# Patient Record
Sex: Male | Born: 1960 | Race: White | Hispanic: No | Marital: Married | Smoking: Never smoker
Health system: Southern US, Community
[De-identification: ages and names within clinical notes are randomized; demographics above are authoritative.]

## PROBLEM LIST (undated history)

## (undated) DIAGNOSIS — E785 Hyperlipidemia, unspecified: Secondary | ICD-10-CM

## (undated) DIAGNOSIS — J45909 Unspecified asthma, uncomplicated: Secondary | ICD-10-CM

## (undated) DIAGNOSIS — Z9889 Other specified postprocedural states: Secondary | ICD-10-CM

## (undated) DIAGNOSIS — Z87442 Personal history of urinary calculi: Secondary | ICD-10-CM

## (undated) DIAGNOSIS — Z973 Presence of spectacles and contact lenses: Secondary | ICD-10-CM

## (undated) DIAGNOSIS — M1990 Unspecified osteoarthritis, unspecified site: Secondary | ICD-10-CM

## (undated) DIAGNOSIS — R7303 Prediabetes: Secondary | ICD-10-CM

## (undated) DIAGNOSIS — R251 Tremor, unspecified: Secondary | ICD-10-CM

## (undated) DIAGNOSIS — K219 Gastro-esophageal reflux disease without esophagitis: Secondary | ICD-10-CM

## (undated) DIAGNOSIS — N401 Enlarged prostate with lower urinary tract symptoms: Secondary | ICD-10-CM

## (undated) DIAGNOSIS — Z86010 Personal history of colonic polyps: Secondary | ICD-10-CM

## (undated) DIAGNOSIS — K08109 Complete loss of teeth, unspecified cause, unspecified class: Secondary | ICD-10-CM

## (undated) DIAGNOSIS — Z860101 Personal history of adenomatous and serrated colon polyps: Secondary | ICD-10-CM

## (undated) DIAGNOSIS — Z972 Presence of dental prosthetic device (complete) (partial): Secondary | ICD-10-CM

## (undated) HISTORY — PX: EXTRACORPOREAL SHOCK WAVE LITHOTRIPSY: SHX1557

## (undated) HISTORY — DX: Unspecified asthma, uncomplicated: J45.909

## (undated) HISTORY — PX: FRACTURE SURGERY: SHX138

## (undated) HISTORY — PX: ESOPHAGOGASTRODUODENOSCOPY (EGD) WITH ESOPHAGEAL DILATION: SHX5812

## (undated) HISTORY — PX: OTHER SURGICAL HISTORY: SHX169

## (undated) SURGERY — Surgical Case
Anesthesia: *Unknown

---

## 2002-05-16 ENCOUNTER — Ambulatory Visit (HOSPITAL_COMMUNITY): Admission: RE | Admit: 2002-05-16 | Discharge: 2002-05-16 | Payer: Self-pay | Admitting: Orthopedic Surgery

## 2002-05-16 ENCOUNTER — Encounter: Payer: Self-pay | Admitting: Orthopedic Surgery

## 2002-05-30 ENCOUNTER — Encounter: Payer: Self-pay | Admitting: Orthopedic Surgery

## 2002-05-30 ENCOUNTER — Ambulatory Visit (HOSPITAL_COMMUNITY): Admission: RE | Admit: 2002-05-30 | Discharge: 2002-05-30 | Payer: Self-pay | Admitting: Orthopedic Surgery

## 2006-04-17 ENCOUNTER — Ambulatory Visit (HOSPITAL_COMMUNITY): Admission: RE | Admit: 2006-04-17 | Discharge: 2006-04-17 | Payer: Self-pay | Admitting: Anesthesiology

## 2008-06-23 ENCOUNTER — Emergency Department (HOSPITAL_COMMUNITY): Admission: EM | Admit: 2008-06-23 | Discharge: 2008-06-23 | Payer: Self-pay | Admitting: Emergency Medicine

## 2008-09-04 ENCOUNTER — Ambulatory Visit (HOSPITAL_COMMUNITY): Admission: RE | Admit: 2008-09-04 | Discharge: 2008-09-04 | Payer: Self-pay | Admitting: Anesthesiology

## 2008-10-26 ENCOUNTER — Ambulatory Visit (HOSPITAL_COMMUNITY): Admission: RE | Admit: 2008-10-26 | Discharge: 2008-10-26 | Payer: Self-pay | Admitting: Anesthesiology

## 2010-05-21 ENCOUNTER — Emergency Department (HOSPITAL_BASED_OUTPATIENT_CLINIC_OR_DEPARTMENT_OTHER): Admission: EM | Admit: 2010-05-21 | Discharge: 2010-05-21 | Payer: Self-pay | Admitting: Emergency Medicine

## 2010-05-27 ENCOUNTER — Emergency Department (HOSPITAL_BASED_OUTPATIENT_CLINIC_OR_DEPARTMENT_OTHER): Admission: EM | Admit: 2010-05-27 | Discharge: 2010-05-27 | Payer: Self-pay | Admitting: Emergency Medicine

## 2011-05-13 LAB — POCT I-STAT, CHEM 8
BUN: 9
Calcium, Ion: 1.21
Creatinine, Ser: 0.9
Glucose, Bld: 100 — ABNORMAL HIGH
TCO2: 27

## 2011-05-13 LAB — URINE MICROSCOPIC-ADD ON

## 2011-05-13 LAB — URINALYSIS, ROUTINE W REFLEX MICROSCOPIC
Bilirubin Urine: NEGATIVE
Leukocytes, UA: NEGATIVE
Nitrite: NEGATIVE
Specific Gravity, Urine: 1.02
pH: 6.5

## 2013-09-09 ENCOUNTER — Emergency Department (HOSPITAL_COMMUNITY): Payer: Medicare Other | Admitting: Anesthesiology

## 2013-09-09 ENCOUNTER — Encounter (HOSPITAL_COMMUNITY): Payer: Self-pay | Admitting: Emergency Medicine

## 2013-09-09 ENCOUNTER — Encounter (HOSPITAL_COMMUNITY)
Admission: EM | Disposition: A | Discharge status: Home / Self Care | Payer: Self-pay | Source: Home / Self Care | Attending: Emergency Medicine

## 2013-09-09 ENCOUNTER — Ambulatory Visit (HOSPITAL_COMMUNITY)
Admission: EM | Admit: 2013-09-09 | Discharge: 2013-09-10 | Disposition: A | Discharge status: Home / Self Care | Payer: Medicare Other | Attending: Urology | Admitting: Urology

## 2013-09-09 ENCOUNTER — Encounter (HOSPITAL_COMMUNITY): Payer: Medicare Other | Admitting: Anesthesiology

## 2013-09-09 DIAGNOSIS — N200 Calculus of kidney: Secondary | ICD-10-CM | POA: Insufficient documentation

## 2013-09-09 DIAGNOSIS — N201 Calculus of ureter: Secondary | ICD-10-CM | POA: Insufficient documentation

## 2013-09-09 DIAGNOSIS — N400 Enlarged prostate without lower urinary tract symptoms: Secondary | ICD-10-CM | POA: Insufficient documentation

## 2013-09-09 DIAGNOSIS — Z87442 Personal history of urinary calculi: Secondary | ICD-10-CM | POA: Insufficient documentation

## 2013-09-09 HISTORY — PX: CYSTOSCOPY/RETROGRADE/URETEROSCOPY/STONE EXTRACTION WITH BASKET: SHX5317

## 2013-09-09 LAB — CBC WITH DIFFERENTIAL/PLATELET
BASOS PCT: 0 % (ref 0–1)
Basophils Absolute: 0 10*3/uL (ref 0.0–0.1)
EOS ABS: 0.1 10*3/uL (ref 0.0–0.7)
EOS PCT: 3 % (ref 0–5)
HEMATOCRIT: 44.5 % (ref 39.0–52.0)
HEMOGLOBIN: 15.2 g/dL (ref 13.0–17.0)
Lymphocytes Relative: 12 % (ref 12–46)
Lymphs Abs: 0.5 10*3/uL — ABNORMAL LOW (ref 0.7–4.0)
MCH: 30.9 pg (ref 26.0–34.0)
MCHC: 34.2 g/dL (ref 30.0–36.0)
MCV: 90.4 fL (ref 78.0–100.0)
Monocytes Absolute: 0.6 10*3/uL (ref 0.1–1.0)
Monocytes Relative: 14 % — ABNORMAL HIGH (ref 3–12)
NEUTROS PCT: 72 % (ref 43–77)
Neutro Abs: 3 10*3/uL (ref 1.7–7.7)
PLATELETS: 231 10*3/uL (ref 150–400)
RBC: 4.92 MIL/uL (ref 4.22–5.81)
RDW: 12.7 % (ref 11.5–15.5)
WBC: 4.2 10*3/uL (ref 4.0–10.5)

## 2013-09-09 LAB — URINALYSIS, ROUTINE W REFLEX MICROSCOPIC
GLUCOSE, UA: NEGATIVE mg/dL
KETONES UR: NEGATIVE mg/dL
LEUKOCYTES UA: NEGATIVE
Nitrite: NEGATIVE
PROTEIN: NEGATIVE mg/dL
Specific Gravity, Urine: 1.029 (ref 1.005–1.030)
UROBILINOGEN UA: 0.2 mg/dL (ref 0.0–1.0)
pH: 5.5 (ref 5.0–8.0)

## 2013-09-09 LAB — BASIC METABOLIC PANEL
BUN: 16 mg/dL (ref 6–23)
CHLORIDE: 100 meq/L (ref 96–112)
CO2: 24 mEq/L (ref 19–32)
Calcium: 8.8 mg/dL (ref 8.4–10.5)
Creatinine, Ser: 0.97 mg/dL (ref 0.50–1.35)
GFR calc non Af Amer: >90 mL/min (ref >90)
Glucose, Bld: 128 mg/dL — ABNORMAL HIGH (ref 70–99)
POTASSIUM: 4.1 meq/L (ref 3.7–5.3)
SODIUM: 137 meq/L (ref 137–147)

## 2013-09-09 LAB — URINE MICROSCOPIC-ADD ON

## 2013-09-09 SURGERY — CYSTOSCOPY, WITH CALCULUS REMOVAL USING BASKET
Anesthesia: General | Site: Ureter | Laterality: Left

## 2013-09-09 MED ORDER — FENTANYL CITRATE 0.05 MG/ML IJ SOLN
INTRAMUSCULAR | Status: AC
  Filled 2013-09-09: qty 2

## 2013-09-09 MED ORDER — HYDROMORPHONE HCL PF 1 MG/ML IJ SOLN
1.0000 mg | Freq: Once | INTRAMUSCULAR | Status: AC
  Administered 2013-09-09: 1 mg via INTRAVENOUS
  Filled 2013-09-09: qty 1

## 2013-09-09 MED ORDER — CIPROFLOXACIN IN D5W 400 MG/200ML IV SOLN
400.0000 mg | Freq: Two times a day (BID) | INTRAVENOUS | Status: DC
  Administered 2013-09-09: 400 mg via INTRAVENOUS
  Filled 2013-09-09 (×3): qty 200

## 2013-09-09 MED ORDER — MIDAZOLAM HCL 5 MG/5ML IJ SOLN
INTRAMUSCULAR | Status: DC | PRN
  Administered 2013-09-09: 2 mg via INTRAVENOUS

## 2013-09-09 MED ORDER — CIPROFLOXACIN IN D5W 400 MG/200ML IV SOLN
INTRAVENOUS | Status: AC
  Filled 2013-09-09: qty 200

## 2013-09-09 MED ORDER — BELLADONNA ALKALOIDS-OPIUM 16.2-60 MG RE SUPP
RECTAL | Status: AC
  Filled 2013-09-09: qty 1

## 2013-09-09 MED ORDER — KETOROLAC TROMETHAMINE 30 MG/ML IJ SOLN
30.0000 mg | Freq: Once | INTRAMUSCULAR | Status: AC
  Administered 2013-09-09: 30 mg via INTRAVENOUS
  Filled 2013-09-09: qty 1

## 2013-09-09 MED ORDER — FENTANYL CITRATE 0.05 MG/ML IJ SOLN
INTRAMUSCULAR | Status: AC
  Filled 2013-09-09: qty 5

## 2013-09-09 MED ORDER — LIDOCAINE HCL (CARDIAC) 10 MG/ML IV SOLN
INTRAVENOUS | Status: DC | PRN
  Administered 2013-09-09: 75 mg via INTRAVENOUS

## 2013-09-09 MED ORDER — ONDANSETRON HCL 4 MG/2ML IJ SOLN
INTRAMUSCULAR | Status: AC
  Filled 2013-09-09: qty 2

## 2013-09-09 MED ORDER — OXYCODONE HCL 5 MG PO TABS: 5.0000 mg | ORAL_TABLET | ORAL | Status: DC | PRN

## 2013-09-09 MED ORDER — LIDOCAINE HCL 2 % EX GEL
CUTANEOUS | Status: DC | PRN
  Administered 2013-09-09: 1 via URETHRAL

## 2013-09-09 MED ORDER — PROMETHAZINE HCL 25 MG/ML IJ SOLN: 12.5000 mg | Freq: Once | INTRAMUSCULAR | Status: AC | PRN

## 2013-09-09 MED ORDER — MIDAZOLAM HCL 2 MG/2ML IJ SOLN
INTRAMUSCULAR | Status: AC
  Filled 2013-09-09: qty 2

## 2013-09-09 MED ORDER — ACTIDOSE WITH SORBITOL 50 GM/240ML PO LIQD
50.0000 g | Freq: Once | ORAL | Status: DC
  Filled 2013-09-09: qty 240

## 2013-09-09 MED ORDER — DEXAMETHASONE SODIUM PHOSPHATE 10 MG/ML IJ SOLN
INTRAMUSCULAR | Status: DC | PRN
  Administered 2013-09-09: 10 mg via INTRAVENOUS

## 2013-09-09 MED ORDER — PROPOFOL 10 MG/ML IV BOLUS
INTRAVENOUS | Status: AC
  Filled 2013-09-09: qty 20

## 2013-09-09 MED ORDER — BELLADONNA ALKALOIDS-OPIUM 16.2-60 MG RE SUPP
RECTAL | Status: DC | PRN
Start: 2013-09-09 — End: 2013-09-09
  Administered 2013-09-09: 1 via RECTAL

## 2013-09-09 MED ORDER — MEPERIDINE HCL 50 MG/ML IJ SOLN: 6.2500 mg | INTRAMUSCULAR | Status: DC | PRN

## 2013-09-09 MED ORDER — INFLUENZA VAC SPLIT QUAD 0.5 ML IM SUSP: 0.5000 mL | INTRAMUSCULAR | Status: DC

## 2013-09-09 MED ORDER — LIDOCAINE HCL 2 % EX GEL
CUTANEOUS | Status: AC
  Filled 2013-09-09: qty 10

## 2013-09-09 MED ORDER — PHENYLEPHRINE HCL 10 MG/ML IJ SOLN
INTRAMUSCULAR | Status: DC | PRN
  Administered 2013-09-09 (×4): 80 ug via INTRAVENOUS

## 2013-09-09 MED ORDER — DEXAMETHASONE SODIUM PHOSPHATE 10 MG/ML IJ SOLN
INTRAMUSCULAR | Status: AC
  Filled 2013-09-09: qty 1

## 2013-09-09 MED ORDER — FENTANYL CITRATE 0.05 MG/ML IJ SOLN
INTRAMUSCULAR | Status: DC | PRN
  Administered 2013-09-09 (×2): 50 ug via INTRAVENOUS

## 2013-09-09 MED ORDER — DOCUSATE SODIUM 100 MG PO CAPS: 100.0000 mg | ORAL_CAPSULE | Freq: Two times a day (BID) | ORAL | Status: DC | PRN

## 2013-09-09 MED ORDER — ONDANSETRON HCL 4 MG/2ML IJ SOLN
INTRAMUSCULAR | Status: DC | PRN
  Administered 2013-09-09: 4 mg via INTRAVENOUS

## 2013-09-09 MED ORDER — KETOROLAC TROMETHAMINE 30 MG/ML IJ SOLN
30.0000 mg | Freq: Once | INTRAMUSCULAR | Status: AC
  Administered 2013-09-09: 30 mg via INTRAVENOUS
  Filled 2013-09-09 (×2): qty 1

## 2013-09-09 MED ORDER — IOHEXOL 300 MG/ML  SOLN
INTRAMUSCULAR | Status: DC | PRN
  Administered 2013-09-09: 10 mL

## 2013-09-09 MED ORDER — LIDOCAINE HCL (CARDIAC) 20 MG/ML IV SOLN
INTRAVENOUS | Status: AC
  Filled 2013-09-09: qty 5

## 2013-09-09 MED ORDER — LACTATED RINGERS IV SOLN
INTRAVENOUS | Status: DC | PRN
  Administered 2013-09-09 (×2): via INTRAVENOUS

## 2013-09-09 MED ORDER — SODIUM CHLORIDE 0.9 % IR SOLN
Status: DC | PRN
  Administered 2013-09-09: 3000 mL

## 2013-09-09 MED ORDER — SODIUM CHLORIDE 0.9 % IR SOLN
Status: DC | PRN
  Administered 2013-09-09: 1000 mL

## 2013-09-09 MED ORDER — PROPOFOL 10 MG/ML IV BOLUS
INTRAVENOUS | Status: DC | PRN
  Administered 2013-09-09: 160 mg via INTRAVENOUS

## 2013-09-09 MED ORDER — FENTANYL CITRATE 0.05 MG/ML IJ SOLN
25.0000 ug | INTRAMUSCULAR | Status: DC | PRN
  Administered 2013-09-09: 50 ug via INTRAVENOUS

## 2013-09-09 SURGICAL SUPPLY — 21 items
BAG URO CATCHER STRL LF (DRAPE) ×3 IMPLANT
BASKET ZERO TIP NITINOL 2.4FR (BASKET) ×2 IMPLANT
BSKT STON RTRVL ZERO TP 2.4FR (BASKET) ×1
CATH URET 5FR 28IN CONE TIP (BALLOONS)
CATH URET 5FR 28IN OPEN ENDED (CATHETERS) ×2 IMPLANT
CATH URET 5FR 70CM CONE TIP (BALLOONS) ×1 IMPLANT
CLOTH BEACON ORANGE TIMEOUT ST (SAFETY) ×3 IMPLANT
DRAPE CAMERA CLOSED 9X96 (DRAPES) ×3 IMPLANT
GLOVE BIOGEL M STRL SZ7.5 (GLOVE) ×3 IMPLANT
GLOVE BIOGEL PI IND STRL 7.0 (GLOVE) IMPLANT
GLOVE BIOGEL PI INDICATOR 7.0 (GLOVE) ×2
GOWN STRL REUS W/TWL XL LVL3 (GOWN DISPOSABLE) ×5 IMPLANT
GUIDEWIRE STR DUAL SENSOR (WIRE) ×3 IMPLANT
IV NS IRRIG 3000ML ARTHROMATIC (IV SOLUTION) ×4 IMPLANT
MANIFOLD NEPTUNE II (INSTRUMENTS) ×3 IMPLANT
NS IRRIG 1000ML POUR BTL (IV SOLUTION) ×2 IMPLANT
PACK CYSTO (CUSTOM PROCEDURE TRAY) ×3 IMPLANT
STENT CONTOUR 6FRX26X.038 (STENTS) ×2 IMPLANT
TUBING CONNECTING 10 (TUBING) ×2 IMPLANT
TUBING CONNECTING 10' (TUBING) ×1
WIRE COONS/BENSON .038X145CM (WIRE) IMPLANT

## 2013-09-09 NOTE — ED Provider Notes (Signed)
Medical screening examination/treatment/procedure(s) were performed by non-physician practitioner and as supervising physician I was immediately available for consultation/collaboration.  Richarda Blade, MD 09/09/13 [phone]

## 2013-09-09 NOTE — ED Provider Notes (Signed)
CSN: [id number]     Arrival date & time 09/09/13  0700 History   First MD Initiated Contact with Patient 09/09/13 [phone]     Chief Complaint  Patient presents with  . Flank Pain   (Consider location/radiation/quality/duration/timing/severity/associated sxs/prior Treatment) HPI  This is a 53 year old male who presents emergency department chief complaint of flank pain.  He should states he was diagnosed 2 weeks ago with a kidney stone that he states is "very low."  Patient states he had a CT scan done.  He complains of 2 stones on the left side.  He states that he was discharged with Percocet, Flomax and has been doing fair at home.  Patient states he's had constant pain radiating to the left groin and testicle.  However yesterday evening his pain became severely worse.  He is also complaining of pain with urination and suprapubic pressure.  The patient denies any fevers, chills.  He endorses nausea and vomiting associated with his pain.  He states he has a history of recurrent kidney stones.  Patient denies any discharge from the penis.        Past Medical History  Diagnosis Date  . Renal disorder     kidney stones   History reviewed. No pertinent past surgical history. History reviewed. No pertinent family history. History  Substance Use Topics  . Smoking status: Never Smoker   . Smokeless tobacco: Never Used  . Alcohol Use: Yes     Comment: occ.    Review of Systems Ten systems reviewed and are negative for acute change, except as noted in the HPI.   Allergies  Review of patient's allergies indicates no known allergies.  Home Medications  No current outpatient prescriptions on file. BP 107/69  Pulse 70  Temp(Src) 97.5 F (36.4 C) (Oral)  Resp 16  Ht 5\' 6"  (1.676 m)  Wt 145 lb (65.772 kg)  BMI 23.41 kg/m2  SpO2 98% Physical Exam Physical Exam  Nursing note and vitals reviewed. Constitutional: Patient appears uncomfortable, rocking and moving about on the examining  bed. Smells strongly of cigarettes. HENT:  Head: Normocephalic and atraumatic.  Eyes: Conjunctivae normal are normal. No scleral icterus.  Neck: Normal range of motion. Neck supple.  Cardiovascular: Normal rate, regular rhythm and normal heart sounds.   Pulmonary/Chest: Effort normal and breath sounds normal. No respiratory distress.  Abdominal: Soft. TTP LLQ. + L CVA tenderness Musculoskeletal: He exhibits no edema.  Neurological: He is alert.  Skin: Skin is warm and dry. He is not diaphoretic.  Psychiatric: His behavior is normal.    ED Course  Procedures (including critical care time) Labs Review Labs Reviewed - No data to display Imaging Review No results found.  EKG Interpretation   None       MDM   1. Ureteral stone    7:31 AM BP 107/69  Pulse 70  Temp(Src) 97.5 F (36.4 C) (Oral)  Resp 16  Ht 5\' 6"  (1.676 m)  Wt 145 lb (65.772 kg)  BMI 23.41 kg/m2  SpO2 98% Patient with dx of kidney stones. Patient appears very uncomfortable.. I will evaluated creatinine and labs. Pain control initiated.   9:30 AM Filed Vitals:   09/09/13 0706  BP: 107/69  Pulse: 70  Temp: 97.5 F (36.4 C)  TempSrc: Oral  Resp: 16  Height: 5\' 6"  (1.676 m)  Weight: 145 lb (65.772 kg)  SpO2: 98%   Patient continues to have pain. Unable to urinate. In/out cath placed. Creatinine wnl  I spoke with Dr. Alinda Money who asks that we give the patient toradol . Reevaluate. Patient CT showed a 9 mm stone in the left ureter.  I will reconsult if pain continues to be poorly controlled.   Patient pain still uncontrolled. Dr. Alinda Money will admit.   Margarita Mail, PA-C 09/09/13 [phone]

## 2013-09-09 NOTE — ED Notes (Signed)
PA at bedside.

## 2013-09-09 NOTE — Consult Note (Signed)
Urology Consult   Physician requesting consult: Abigail Harris, PA-C  Reason for consult: Left ureteral stone  History of Present Illness: Jose Hartman is a 53 y.o. with a known 9 mm distal left ureteral stone who is followed by Dr. Ottelin.  He presented to the ED with uncontrolled pain today.  His pain is improved but still significant and is localized to the left lower quadrant. He denies fever.  He has had some nausea but no vomiting.  He has a long history of kidney stones in the past.  He has had his current stone for 2 weeks and was supposed to follow up with Dr. Ottelin on Wednesday but had canceled his appointment.    Past Medical History  Diagnosis Date  . Renal disorder     kidney stones    Past Surgical History  Procedure Laterality Date  . Extracorporeal shock wave lithotripsy    . Uretheral stent placement      Current Hospital Medications:  Home Meds:    Medication List    Notice   You have not been prescribed any medications.      Scheduled Meds: . [START ON 09/10/2013] influenza vac split quadrivalent PF  0.5 mL Intramuscular Tomorrow-1000   Continuous Infusions:  PRN Meds:.  Allergies:  Allergies  Allergen Reactions  . Peanuts [Peanut Oil] Hives    Makes ears red and burn     History reviewed. No pertinent family history.  Social History:  reports that he has never smoked. He has never used smokeless tobacco. He reports that he drinks alcohol. He reports that he does not use illicit drugs.  ROS: A complete review of systems was performed.  All systems are negative except for pertinent findings as noted.  Physical Exam:  Vital signs in last 24 hours: Temp:  [97.5 F (36.4 C)] 97.5 F (36.4 C) (01/30 0706) Pulse Rate:  [70-87] 87 (01/30 1201) Resp:  [16-20] 20 (01/30 1201) BP: (107-127)/(69-89) 127/89 mmHg (01/30 1201) SpO2:  [94 %-98 %] 94 % (01/30 1201) Weight:  [65.772 kg (145 lb)] 65.772 kg (145 lb) (01/30 0706) Constitutional:   Alert and oriented, No acute distress Cardiovascular: Regular rate and rhythm, No JVD Respiratory: Normal respiratory effort, Lungs clear bilaterally GI: LLQ pain, No rebound tenderness or guarding GU: No CVA tenderness Lymphatic: No lymphadenopathy Neurologic: Grossly intact, no focal deficits Psychiatric: Normal mood and affect  Laboratory Data:   Recent Labs  09/09/13 0745  WBC 4.2  HGB 15.2  HCT 44.5  PLT 231     Recent Labs  09/09/13 0745  NA 137  K 4.1  CL 100  GLUCOSE 128*  BUN 16  CALCIUM 8.8  CREATININE 0.97     Results for orders placed during the hospital encounter of 09/09/13 (from the past 24 hour(s))  BASIC METABOLIC PANEL     Status: Abnormal   Collection Time    09/09/13  7:45 AM      Result Value Range   Sodium 137  137 - 147 mEq/L   Potassium 4.1  3.7 - 5.3 mEq/L   Chloride 100  96 - 112 mEq/L   CO2 24  19 - 32 mEq/L   Glucose, Bld 128 (*) 70 - 99 mg/dL   BUN 16  6 - 23 mg/dL   Creatinine, Ser 0.97  0.50 - 1.35 mg/dL   Calcium 8.8  8.4 - 10.5 mg/dL   GFR calc non Af Amer >90  >90 mL/min   GFR   calc Af Amer >90  >90 mL/min  CBC WITH DIFFERENTIAL     Status: Abnormal   Collection Time    09/09/13  7:45 AM      Result Value Range   WBC 4.2  4.0 - 10.5 K/uL   RBC 4.92  4.22 - 5.81 MIL/uL   Hemoglobin 15.2  13.0 - 17.0 g/dL   HCT 44.5  39.0 - 52.0 %   MCV 90.4  78.0 - 100.0 fL   MCH 30.9  26.0 - 34.0 pg   MCHC 34.2  30.0 - 36.0 g/dL   RDW 12.7  11.5 - 15.5 %   Platelets 231  150 - 400 K/uL   Neutrophils Relative % 72  43 - 77 %   Neutro Abs 3.0  1.7 - 7.7 K/uL   Lymphocytes Relative 12  12 - 46 %   Lymphs Abs 0.5 (*) 0.7 - 4.0 K/uL   Monocytes Relative 14 (*) 3 - 12 %   Monocytes Absolute 0.6  0.1 - 1.0 K/uL   Eosinophils Relative 3  0 - 5 %   Eosinophils Absolute 0.1  0.0 - 0.7 K/uL   Basophils Relative 0  0 - 1 %   Basophils Absolute 0.0  0.0 - 0.1 K/uL  URINALYSIS, ROUTINE W REFLEX MICROSCOPIC     Status: Abnormal   Collection  Time    09/09/13  9:40 AM      Result Value Range   Color, Urine AMBER (*) YELLOW   APPearance CLEAR  CLEAR   Specific Gravity, Urine 1.029  1.005 - 1.030   pH 5.5  5.0 - 8.0   Glucose, UA NEGATIVE  NEGATIVE mg/dL   Hgb urine dipstick SMALL (*) NEGATIVE   Bilirubin Urine SMALL (*) NEGATIVE   Ketones, ur NEGATIVE  NEGATIVE mg/dL   Protein, ur NEGATIVE  NEGATIVE mg/dL   Urobilinogen, UA 0.2  0.0 - 1.0 mg/dL   Nitrite NEGATIVE  NEGATIVE   Leukocytes, UA NEGATIVE  NEGATIVE  URINE MICROSCOPIC-ADD ON     Status: None   Collection Time    09/09/13  9:40 AM      Result Value Range   Squamous Epithelial / LPF RARE  RARE   RBC / HPF 3-6  <3 RBC/hpf   Urine-Other MUCOUS PRESENT     No results found for this or any previous visit (from the past 240 hour(s)).  Renal Function:  Recent Labs  09/09/13 0745  CREATININE 0.97   Estimated Creatinine Clearance: 78.6 ml/min (by C-G formula based on Cr of 0.97).  Radiologic Imaging: No results found.  I independently reviewed his CT scan from 08/23/13.  Impression/Recommendation 1) Distal left ureteral stone: He has uncontrolled pain and requires intervention.  We reviewed options and he will proceed with left ureteroscopic laser lithotripsy by Dr. Louis Meckel later tonight.  He will remain NPO. I discussed the potential benefits and risks of the procedure, side effects of the proposed treatment, the likelihood of the patient achieving the goals of the procedure, and any potential problems that might occur during the procedure or recuperation. He agrees to proceed and gives informed consent.  Bert Givans,LES 09/09/2013, 3:00 PM    Pryor Curia MD   CC: Margarita Mail, PA-C

## 2013-09-09 NOTE — Progress Notes (Addendum)
   CARE MANAGEMENT ED NOTE 09/09/2013  Patient:  [place], GROSSER   Account Number:  [id number]  Date Initiated:  09/09/2013  Documentation initiated by:  Jackelyn Poling  Subjective/Objective Assessment:   53 yr old medicare [place] pt without pcp listed Reports he only sees an urologist Male at bedside recommended [place] [facility] to the pt He agreed to receive a list of medicare MDs within his zip code     Subjective/Objective Assessment Detail:     Action/Plan:   cm spoke with pt and females x 2 at bedside   Action/Plan Detail:   Anticipated DC Date:  09/09/2013     Status Recommendation to Physician:   Result of Recommendation:    Other ED [place]  Other  PCP issues  Outpatient Services - Pt will follow up    Choice offered to / List presented to:            Status of service:  Completed, signed off  ED Comments:   ED Comments Detail:   Provided pt with a list of medicare providers from medicare.gov within zip code [phone]

## 2013-09-09 NOTE — ED Notes (Signed)
Pt reports he was seen at [facility] x2 weeks ago for kidney stones, now pt is having lower pelvic pain especially burning with urination since 0200 this morning. Pt reports taking at home pain medication without relief. Pt denies n/v but states he has had diarrhea. Pt a&o x4, ambulatory to triage.

## 2013-09-09 NOTE — Op Note (Signed)
Preoperative diagnosis: left ureteral calculus  Postoperative diagnosis: left ureteral calculus  Procedure:  1. Cystoscopy 2. left ureteroscopy 3. left 3F x 26 ureteral stent placement  4. left retrograde pyelography with interpretation  Surgeon: Ardis Hughs, MD  Anesthesia: General  Complications: None  Intraoperative findings: left retrograde pyelography demonstrated no ureteral filling defect, however there was a filling defect noted in the lower pole of the left kidney consistent with the patient's stone. The patient's stone was likely pushed up into the kidney with placement of a 0.38 sensor wire.  EBL: Minimal  Specimens: 1. None  Disposition of specimens: [facility] for stone analysis  Indication: Jose Hartman is a 53 y.o.   patient with urolithiasis. After reviewing the management options for treatment, the patient elected to proceed with the above surgical procedure(s). We have discussed the potential benefits and risks of the procedure, side effects of the proposed treatment, the likelihood of the patient achieving the goals of the procedure, and any potential problems that might occur during the procedure or recuperation. Informed consent has been obtained.  Description of procedure:  The patient was taken to the operating room and general anesthesia was induced.  The patient was placed in the dorsal lithotomy position, prepped and draped in the usual sterile fashion, and preoperative antibiotics were administered. A preoperative time-out was performed.   Cystourethroscopy was performed.  The patient's urethra was examined and was normal/ demonstrated bilobar prostatic hypertrophy. The bladder was then systematically examined in its entirety. There was no evidence for any bladder tumors, stones, or other mucosal pathology.    Attention then turned to the left ureteral orifice and a ureteral catheter was used to intubate the ureteral  orifice.  Omnipaque contrast was injected through the ureteral catheter and a retrograde pyelogram was performed with findings as dictated above.  A 0.38 sensor guidewire was then advanced up the left ureter into the renal pelvis under fluoroscopic guidance. The 6 Fr semirigid ureteroscope was then advanced into the ureter next to the guidewire and in the ureter was inspected up into the renal pelvis. There was no ureteral calculi identified. I then passed a second 0.38 sensor wire and advanced the flexible ureteroscope over the wire into the renal pelvis. I then inspected the left renal pelvis and identified the renal calculus in the lower pole. However the angle of the of the calyx was prohibitive from basketing the stone. The stone was not retrievable.  Reinspection of the ureter revealed no remaining visible stones or fragments.   The wire was then backloaded through the cystoscope and a ureteral stent was advance over the wire using Seldinger technique.  The stent was positioned appropriately under fluoroscopic and cystoscopic guidance.  The wire was then removed with an adequate stent curl noted in the renal pelvis as well as in the bladder.  The bladder was then emptied and the procedure ended.  The patient appeared to tolerate the procedure well and without complications.  The patient was able to be awakened and transferred to the recovery unit in satisfactory condition.   Disposition: The patient will followup with Dr. Kathie Rhodes and 5-7 days with a KUB prior.  Cc Dr. Kathie Rhodes, MD

## 2013-09-09 NOTE — ED Notes (Signed)
Daughter out to desk asking if father is able to have more pain meds- RN Apolonio Schneiders notified.

## 2013-09-09 NOTE — Anesthesia Postprocedure Evaluation (Signed)
  Anesthesia Post-op Note  Patient: Jose Hartman  Procedure(s) Performed: Procedure(s): CYSTOSCOPY/LEFT RETROGRADE PYELOGRAM /FLEXIBLE URETEROSCOPY/INSERTION LEFT URETERAL STENT (Left)  Patient Location: PACU  Anesthesia Type:General  Level of Consciousness: awake, alert  and oriented  Airway and Oxygen Therapy: Patient Spontanous Breathing  Post-op Pain: mild  Post-op Assessment: Post-op Vital signs reviewed, Patient's Cardiovascular Status Stable, Respiratory Function Stable, Patent Airway, No signs of Nausea or vomiting and Pain level controlled  Post-op Vital Signs: Reviewed and stable  Complications: No apparent anesthesia complications

## 2013-09-09 NOTE — Transfer of Care (Signed)
Immediate Anesthesia Transfer of Care Note  Patient: Jose Hartman  Procedure(s) Performed: Procedure(s): CYSTOSCOPY/LEFT RETROGRADE PYELOGRAM /FLEXIBLE URETEROSCOPY/INSERTION LEFT URETERAL STENT (Left)  Patient Location: PACU  Anesthesia Type:General  Level of Consciousness: awake, alert , oriented and patient cooperative  Airway & Oxygen Therapy: Patient Spontanous Breathing and Patient connected to face mask oxygen  Post-op Assessment: Report given to PACU RN, Post -op Vital signs reviewed and stable and Patient moving all extremities X 4  Post vital signs: stable  Complications: No apparent anesthesia complications

## 2013-09-09 NOTE — Discharge Instructions (Signed)
DISCHARGE INSTRUCTIONS FOR KIDNEY STONES OR URETERAL STENT   MEDICATIONS:  1. DO NOT RESUME YOUR ASPIRIN, or any other medicines like ibuprofen, motrin, excedrin, advil, aleve, vitamin E, fish oil as these can all cause bleeding x 7 days.  2. Resume all your other meds from home - except do not take any other pain meds that you may have at home.    ACTIVITY:  1. No strenuous activity x 1week  2. No driving while on narcotic pain medications  3. Drink plenty of water  4. Continue to walk at home - you can still get blood clots when you are at home, so keep active, but don't over do it.  5. May return to work in 3 days.   BATHING:  1. You can shower and we recommend daily showers    SIGNS/SYMPTOMS TO CALL:  Please call us if you have a fever greater than 101.5, uncontrolled nausea/vomiting, uncontrolled pain, dizziness, unable to urinate, bloody urine, chest pain, shortness of breath, leg swelling, leg pain, redness around wound, drainage from wound, or any other concerns or questions.   You can reach [place] at [phone].   FOLLOW-UP:  1. You will be scheduled for stent removal with Dr. Karsten Ro in the next 5-7 days.  We will contact you with this appointment.

## 2013-09-09 NOTE — H&P (View-Only) (Signed)
Urology Consult   Physician requesting consult: Margarita Mail, PA-C  Reason for consult: Left ureteral stone  History of Present Illness: Jose Hartman is a 53 y.o. with a known 9 mm distal left ureteral stone who is followed by Dr. Karsten Ro.  He presented to the ED with uncontrolled pain today.  His pain is improved but still significant and is localized to the left lower quadrant. He denies fever.  He has had some nausea but no vomiting.  He has a long history of kidney stones in the past.  He has had his current stone for 2 weeks and was supposed to follow up with Dr. Karsten Ro on Wednesday but had canceled his appointment.    Past Medical History  Diagnosis Date  . Renal disorder     kidney stones    Past Surgical History  Procedure Laterality Date  . Extracorporeal shock wave lithotripsy    . Uretheral stent placement      Current Hospital Medications:  Home Meds:    Medication List    Notice   You have not been prescribed any medications.      Scheduled Meds: . [START ON 09/10/2013] influenza vac split quadrivalent PF  0.5 mL Intramuscular Tomorrow-1000   Continuous Infusions:  PRN Meds:.  Allergies:  Allergies  Allergen Reactions  . Peanuts [Peanut Oil] Hives    Makes ears red and burn     History reviewed. No pertinent family history.  Social History:  reports that he has never smoked. He has never used smokeless tobacco. He reports that he drinks alcohol. He reports that he does not use illicit drugs.  ROS: A complete review of systems was performed.  All systems are negative except for pertinent findings as noted.  Physical Exam:  Vital signs in last 24 hours: Temp:  [97.5 F (36.4 C)] 97.5 F (36.4 C) (01/30 0706) Pulse Rate:  [70-87] 87 (01/30 1201) Resp:  [16-20] 20 (01/30 1201) BP: (107-127)/(69-89) 127/89 mmHg (01/30 1201) SpO2:  [94 %-98 %] 94 % (01/30 1201) Weight:  [65.772 kg (145 lb)] 65.772 kg (145 lb) (01/30 0706) Constitutional:   Alert and oriented, No acute distress Cardiovascular: Regular rate and rhythm, No JVD Respiratory: Normal respiratory effort, Lungs clear bilaterally GI: LLQ pain, No rebound tenderness or guarding GU: No CVA tenderness Lymphatic: No lymphadenopathy Neurologic: Grossly intact, no focal deficits Psychiatric: Normal mood and affect  Laboratory Data:   Recent Labs  09/09/13 0745  WBC 4.2  HGB 15.2  HCT 44.5  PLT 231     Recent Labs  09/09/13 0745  NA 137  K 4.1  CL 100  GLUCOSE 128*  BUN 16  CALCIUM 8.8  CREATININE 0.97     Results for orders placed during the hospital encounter of 09/09/13 (from the past 24 hour(s))  BASIC METABOLIC PANEL     Status: Abnormal   Collection Time    09/09/13  7:45 AM      Result Value Range   Sodium 137  137 - 147 mEq/L   Potassium 4.1  3.7 - 5.3 mEq/L   Chloride 100  96 - 112 mEq/L   CO2 24  19 - 32 mEq/L   Glucose, Bld 128 (*) 70 - 99 mg/dL   BUN 16  6 - 23 mg/dL   Creatinine, Ser 0.97  0.50 - 1.35 mg/dL   Calcium 8.8  8.4 - 10.5 mg/dL   GFR calc non Af Amer >90  >90 mL/min   GFR  calc Af Amer >90  >90 mL/min  CBC WITH DIFFERENTIAL     Status: Abnormal   Collection Time    09/09/13  7:45 AM      Result Value Range   WBC 4.2  4.0 - 10.5 K/uL   RBC 4.92  4.22 - 5.81 MIL/uL   Hemoglobin 15.2  13.0 - 17.0 g/dL   HCT 44.5  39.0 - 52.0 %   MCV 90.4  78.0 - 100.0 fL   MCH 30.9  26.0 - 34.0 pg   MCHC 34.2  30.0 - 36.0 g/dL   RDW 12.7  11.5 - 15.5 %   Platelets 231  150 - 400 K/uL   Neutrophils Relative % 72  43 - 77 %   Neutro Abs 3.0  1.7 - 7.7 K/uL   Lymphocytes Relative 12  12 - 46 %   Lymphs Abs 0.5 (*) 0.7 - 4.0 K/uL   Monocytes Relative 14 (*) 3 - 12 %   Monocytes Absolute 0.6  0.1 - 1.0 K/uL   Eosinophils Relative 3  0 - 5 %   Eosinophils Absolute 0.1  0.0 - 0.7 K/uL   Basophils Relative 0  0 - 1 %   Basophils Absolute 0.0  0.0 - 0.1 K/uL  URINALYSIS, ROUTINE W REFLEX MICROSCOPIC     Status: Abnormal   Collection  Time    09/09/13  9:40 AM      Result Value Range   Color, Urine AMBER (*) YELLOW   APPearance CLEAR  CLEAR   Specific Gravity, Urine 1.029  1.005 - 1.030   pH 5.5  5.0 - 8.0   Glucose, UA NEGATIVE  NEGATIVE mg/dL   Hgb urine dipstick SMALL (*) NEGATIVE   Bilirubin Urine SMALL (*) NEGATIVE   Ketones, ur NEGATIVE  NEGATIVE mg/dL   Protein, ur NEGATIVE  NEGATIVE mg/dL   Urobilinogen, UA 0.2  0.0 - 1.0 mg/dL   Nitrite NEGATIVE  NEGATIVE   Leukocytes, UA NEGATIVE  NEGATIVE  URINE MICROSCOPIC-ADD ON     Status: None   Collection Time    09/09/13  9:40 AM      Result Value Range   Squamous Epithelial / LPF RARE  RARE   RBC / HPF 3-6  <3 RBC/hpf   Urine-Other MUCOUS PRESENT     No results found for this or any previous visit (from the past 240 hour(s)).  Renal Function:  Recent Labs  09/09/13 0745  CREATININE 0.97   Estimated Creatinine Clearance: 78.6 ml/min (by C-G formula based on Cr of 0.97).  Radiologic Imaging: No results found.  I independently reviewed his CT scan from 08/23/13.  Impression/Recommendation 1) Distal left ureteral stone: He has uncontrolled pain and requires intervention.  We reviewed options and he will proceed with left ureteroscopic laser lithotripsy by Dr. Louis Meckel later tonight.  He will remain NPO. I discussed the potential benefits and risks of the procedure, side effects of the proposed treatment, the likelihood of the patient achieving the goals of the procedure, and any potential problems that might occur during the procedure or recuperation. He agrees to proceed and gives informed consent.  Stefano Trulson,LES 09/09/2013, 3:00 PM    Pryor Curia MD   CC: Margarita Mail, PA-C

## 2013-09-09 NOTE — Interval H&P Note (Signed)
History and Physical Interval Note:  09/09/2013 9:57 PM  Jose Hartman  has presented today for surgery, with the diagnosis of left ureteral stone  The various methods of treatment have been discussed with the patient and family. After consideration of risks, benefits and other options for treatment, the patient has consented to  Procedure(s): CYSTOSCOPY/RETROGRADE/URETEROSCOPY/STONE EXTRACTION WITH BASKET (Left) as a surgical intervention .  The patient's history has been reviewed, patient examined, no change in status, stable for surgery.  I have reviewed the patient's chart and labs.  Questions were answered to the patient's satisfaction.     Louis Meckel W

## 2013-09-09 NOTE — Anesthesia Preprocedure Evaluation (Signed)
Anesthesia Evaluation  Patient identified by MRN, date of birth, ID band Patient awake    Reviewed: Allergy & Precautions, H&P , NPO status , Patient's Chart, lab work & pertinent test results  Airway Mallampati: I TM Distance: >3 FB Neck ROM: Full    Dental  (+) Edentulous Upper and Edentulous Lower   Pulmonary neg pulmonary ROS,  breath sounds clear to auscultation  Pulmonary exam normal       Cardiovascular negative cardio ROS  Rhythm:Regular Rate:Normal     Neuro/Psych negative neurological ROS  negative psych ROS   GI/Hepatic negative GI ROS, Neg liver ROS,   Endo/Other  negative endocrine ROS  Renal/GU Renal diseaseHx/o Renal calculi w/ current left ureteral calculus  negative genitourinary   Musculoskeletal negative musculoskeletal ROS (+)   Abdominal   Peds  Hematology negative hematology ROS (+)   Anesthesia Other Findings   Reproductive/Obstetrics                           Anesthesia Physical Anesthesia Plan  ASA: II and emergent  Anesthesia Plan: General   Post-op Pain Management:    Induction: Intravenous  Airway Management Planned: LMA  Additional Equipment:   Intra-op Plan:   Post-operative Plan: Extubation in OR  Informed Consent: I have reviewed the patients History and Physical, chart, labs and discussed the procedure including the risks, benefits and alternatives for the proposed anesthesia with the patient or authorized representative who has indicated his/her understanding and acceptance.   Dental advisory given  Plan Discussed with: Anesthesiologist, CRNA and Surgeon  Anesthesia Plan Comments:         Anesthesia Quick Evaluation

## 2013-09-10 LAB — URINE CULTURE
Colony Count: NO GROWTH
Culture: NO GROWTH
SPECIAL REQUESTS: NORMAL

## 2013-09-12 ENCOUNTER — Encounter (HOSPITAL_COMMUNITY): Payer: Self-pay | Admitting: Urology

## 2015-09-15 ENCOUNTER — Encounter (HOSPITAL_COMMUNITY): Payer: Self-pay | Admitting: Emergency Medicine

## 2015-09-15 ENCOUNTER — Emergency Department (HOSPITAL_COMMUNITY)
Admission: EM | Admit: 2015-09-15 | Discharge: 2015-09-15 | Disposition: A | Discharge status: Home / Self Care | Payer: Medicare Other | Attending: Emergency Medicine | Admitting: Emergency Medicine

## 2015-09-15 DIAGNOSIS — Z87442 Personal history of urinary calculi: Secondary | ICD-10-CM | POA: Insufficient documentation

## 2015-09-15 DIAGNOSIS — J0100 Acute maxillary sinusitis, unspecified: Secondary | ICD-10-CM | POA: Insufficient documentation

## 2015-09-15 DIAGNOSIS — R0981 Nasal congestion: Secondary | ICD-10-CM | POA: Diagnosis present

## 2015-09-15 MED ORDER — PREDNISONE 20 MG PO TABS
60.0000 mg | ORAL_TABLET | Freq: Once | ORAL | Status: AC
  Administered 2015-09-15: 60 mg via ORAL
  Filled 2015-09-15: qty 3

## 2015-09-15 MED ORDER — AMOXICILLIN 500 MG PO CAPS: 500.0000 mg | ORAL_CAPSULE | Freq: Three times a day (TID) | ORAL | Status: DC

## 2015-09-15 MED ORDER — FLUTICASONE PROPIONATE 50 MCG/ACT NA SUSP: NASAL | Status: DC

## 2015-09-15 NOTE — Discharge Instructions (Signed)

## 2015-09-15 NOTE — ED Notes (Signed)
Pt reports chronic sinus infection; reports taking "everything you can think of" without relief.

## 2015-09-15 NOTE — ED Provider Notes (Signed)
CSN: [id number]     Arrival date & time 09/15/15  [id number] History   First MD Initiated Contact with Patient 09/15/15 0940     Chief Complaint  Patient presents with  . Sinusitis     HPI  Patient presents evaluation with a 4 month history of difficulty breathing through his nose. He said previous sinus infections,. Had surgery for sinuses and removal of frontal sinus polyps. States he has told that he had polyps from his maxilla into his left naris as well. For the last 2 weeks had difficulty sleeping because of nasal congestion. He wakes himself out because of snoring or sudden apneic periods at night related to his nasal congestion. Screen nasal discharge. He has pressure but no pain. No fever.  Past Medical History  Diagnosis Date  . Renal disorder     kidney stones   Past Surgical History  Procedure Laterality Date  . Extracorporeal shock wave lithotripsy    . Uretheral stent placement    . Cystoscopy/retrograde/ureteroscopy/stone extraction with basket Left 09/09/2013    Procedure: CYSTOSCOPY/LEFT RETROGRADE PYELOGRAM /FLEXIBLE URETEROSCOPY/INSERTION LEFT URETERAL STENT;  Surgeon: Ardis Hughs, MD;  Location: [facility];  Service: Urology;  Laterality: Left;   No family history on file. Social History  Substance Use Topics  . Smoking status: Never Smoker   . Smokeless tobacco: Never Used  . Alcohol Use: Yes     Comment: occ.    Review of Systems  Constitutional: Negative for fever, chills, diaphoresis, appetite change and fatigue.  HENT: Positive for congestion, postnasal drip, rhinorrhea, sinus pressure and sneezing. Negative for mouth sores, sore throat and trouble swallowing.   Eyes: Negative for visual disturbance.  Respiratory: Negative for cough, chest tightness, shortness of breath and wheezing.   Cardiovascular: Negative for chest pain.  Gastrointestinal: Negative for nausea, vomiting, abdominal pain, diarrhea and abdominal distention.  Endocrine: Negative for  polydipsia, polyphagia and polyuria.  Genitourinary: Negative for dysuria, frequency and hematuria.  Musculoskeletal: Negative for gait problem.  Skin: Negative for color change, pallor and rash.  Neurological: Negative for dizziness, syncope, light-headedness and headaches.  Hematological: Does not bruise/bleed easily.  Psychiatric/Behavioral: Negative for behavioral problems and confusion.      Allergies  Peanuts  Home Medications   Prior to Admission medications   Medication Sig Start Date End Date Taking? Authorizing Provider  OVER THE COUNTER MEDICATION Dayquil   Yes Historical Provider, MD  Phenyleph-Doxylamine-DM-APAP (ALKA-SELTZER PLS NIGHT CLD/FLU) 5-6.25-10-325 MG CAPS Take 1 tablet by mouth 2 (two) times daily as needed (cold symptoms).   Yes Historical Provider, MD  amoxicillin (AMOXIL) 500 MG capsule Take 1 capsule (500 mg total) by mouth 3 (three) times daily. 09/15/15   Tanna Furry, MD  fluticasone Asencion Islam) 50 MCG/ACT nasal spray 1 spray each nares twice a day 09/15/15   Tanna Furry, MD   BP 115/84 mmHg  Pulse 71  Temp(Src) 98.1 F (36.7 C) (Oral)  Resp 18  Ht 5\' 6"  (1.676 m)  Wt 142 lb (64.411 kg)  BMI 22.93 kg/m2  SpO2 100% Physical Exam  Constitutional: He is oriented to person, place, and time. He appears well-developed and well-nourished. No distress.  HENT:  Head: Normocephalic.  Nose:    Eyes: Conjunctivae are normal. Pupils are equal, round, and reactive to light. No scleral icterus.  Neck: Normal range of motion. Neck supple. No thyromegaly present.  Cardiovascular: Normal rate and regular rhythm.  Exam reveals no gallop and no friction rub.   No murmur heard.  Pulmonary/Chest: Effort normal and breath sounds normal. No respiratory distress. He has no wheezes. He has no rales.  Abdominal: Soft. Bowel sounds are normal. He exhibits no distension. There is no tenderness. There is no rebound.  Musculoskeletal: Normal range of motion.  Neurological: He is  alert and oriented to person, place, and time.  Skin: Skin is warm and dry. No rash noted.  Psychiatric: He has a normal mood and affect. His behavior is normal.    ED Course  Procedures (including critical care time) Labs Review Labs Reviewed - No data to display  Imaging Review No results found. I have personally reviewed and evaluated these images and lab results as part of my medical decision-making.   EKG Interpretation None      MDM   Final diagnoses:  Subacute maxillary sinusitis    Plan morning Sudafed, twice a day Flonase, amoxicillin. Symptoms prednisone here. ENT follow-up    Tanna Furry, MD 09/15/15 [phone]

## 2015-11-22 ENCOUNTER — Encounter (HOSPITAL_COMMUNITY): Payer: Self-pay

## 2015-11-22 ENCOUNTER — Emergency Department (HOSPITAL_COMMUNITY)
Admission: EM | Admit: 2015-11-22 | Discharge: 2015-11-22 | Disposition: A | Discharge status: Home / Self Care | Payer: Medicare Other | Attending: Emergency Medicine | Admitting: Emergency Medicine

## 2015-11-22 ENCOUNTER — Emergency Department (HOSPITAL_COMMUNITY): Payer: Medicare Other

## 2015-11-22 DIAGNOSIS — Z87448 Personal history of other diseases of urinary system: Secondary | ICD-10-CM | POA: Insufficient documentation

## 2015-11-22 DIAGNOSIS — Z7951 Long term (current) use of inhaled steroids: Secondary | ICD-10-CM | POA: Diagnosis not present

## 2015-11-22 DIAGNOSIS — Z87442 Personal history of urinary calculi: Secondary | ICD-10-CM | POA: Diagnosis not present

## 2015-11-22 DIAGNOSIS — J45901 Unspecified asthma with (acute) exacerbation: Secondary | ICD-10-CM | POA: Diagnosis not present

## 2015-11-22 DIAGNOSIS — J339 Nasal polyp, unspecified: Secondary | ICD-10-CM | POA: Diagnosis not present

## 2015-11-22 DIAGNOSIS — R0602 Shortness of breath: Secondary | ICD-10-CM | POA: Diagnosis present

## 2015-11-22 DIAGNOSIS — Z792 Long term (current) use of antibiotics: Secondary | ICD-10-CM | POA: Diagnosis not present

## 2015-11-22 DIAGNOSIS — J9801 Acute bronchospasm: Secondary | ICD-10-CM

## 2015-11-22 MED ORDER — PREDNISONE 20 MG PO TABS: ORAL_TABLET | ORAL | Status: DC

## 2015-11-22 MED ORDER — ALBUTEROL SULFATE (2.5 MG/3ML) 0.083% IN NEBU
5.0000 mg | INHALATION_SOLUTION | Freq: Once | RESPIRATORY_TRACT | Status: AC
Start: 2015-11-22 — End: 2015-11-22
  Administered 2015-11-22: 5 mg via RESPIRATORY_TRACT
  Filled 2015-11-22: qty 6

## 2015-11-22 MED ORDER — PREDNISONE 20 MG PO TABS
60.0000 mg | ORAL_TABLET | Freq: Once | ORAL | Status: AC
  Administered 2015-11-22: 60 mg via ORAL
  Filled 2015-11-22: qty 3

## 2015-11-22 MED ORDER — ALBUTEROL SULFATE HFA 108 (90 BASE) MCG/ACT IN AERS
2.0000 | INHALATION_SPRAY | RESPIRATORY_TRACT | Status: DC | PRN
  Administered 2015-11-22: 2 via RESPIRATORY_TRACT
  Filled 2015-11-22: qty 6.7

## 2015-11-22 NOTE — ED Notes (Signed)
Pt ambulated about 20 feet without assistance and his O2 levels stayed above 94%.

## 2015-11-22 NOTE — ED Notes (Signed)
Pt states he was having shortness of breath at home and the left side of his chest hurts when he breathes  Pt states he had a breathing treatment in triage and feels much better

## 2015-11-22 NOTE — ED Notes (Signed)
Pt complains of being short of breath and asthma for about three weeks, also a productive cough

## 2015-11-22 NOTE — Discharge Instructions (Signed)
Please follow up with ENT specialist for further evaluation of your nasal polyps.  Use albuterol inhaler 2 puffs every 4 hrs as needed for shortness of breath. Take steroid as prescribed.  Find a primary care provider.  Return if you have any concerns.  Asthma, Acute Bronchospasm Acute bronchospasm caused by asthma is also referred to as an asthma attack. Bronchospasm means your air passages become narrowed. The narrowing is caused by inflammation and tightening of the muscles in the air tubes (bronchi) in your lungs. This can make it hard to breathe or cause you to wheeze and cough. CAUSES Possible triggers are:  Animal dander from the skin, hair, or feathers of animals.  Dust mites contained in house dust.  Cockroaches.  Pollen from trees or grass.  Mold.  Cigarette or tobacco smoke.  Air pollutants such as dust, household cleaners, hair sprays, aerosol sprays, paint fumes, strong chemicals, or strong odors.  Cold air or weather changes. Cold air may trigger inflammation. Winds increase molds and pollens in the air.  Strong emotions such as crying or laughing hard.  Stress.  Certain medicines such as aspirin or beta-blockers.  Sulfites in foods and drinks, such as dried fruits and wine.  Infections or inflammatory conditions, such as a flu, cold, or inflammation of the nasal membranes (rhinitis).  Gastroesophageal reflux disease (GERD). GERD is a condition where stomach acid backs up into your esophagus.  Exercise or strenuous activity. SIGNS AND SYMPTOMS   Wheezing.  Excessive coughing, particularly at night.  Chest tightness.  Shortness of breath. DIAGNOSIS  Your health care provider will ask you about your medical history and perform a physical exam. A chest X-ray or blood testing may be performed to look for other causes of your symptoms or other conditions that may have triggered your asthma attack. TREATMENT  Treatment is aimed at reducing inflammation and  opening up the airways in your lungs. Most asthma attacks are treated with inhaled medicines. These include quick relief or rescue medicines (such as bronchodilators) and controller medicines (such as inhaled corticosteroids). These medicines are sometimes given through an inhaler or a nebulizer. Systemic steroid medicine taken by mouth or given through an IV tube also can be used to reduce the inflammation when an attack is moderate or severe. Antibiotic medicines are only used if a bacterial infection is present.  HOME CARE INSTRUCTIONS   Rest.  Drink plenty of liquids. This helps the mucus to remain thin and be easily coughed up. Only use caffeine in moderation and do not use alcohol until you have recovered from your illness.  Do not smoke. Avoid being exposed to secondhand smoke.  You play a critical role in keeping yourself in good health. Avoid exposure to things that cause you to wheeze or to have breathing problems.  Keep your medicines up-to-date and available. Carefully follow your health care provider's treatment plan.  Take your medicine exactly as prescribed.  When pollen or pollution is bad, keep windows closed and use an air conditioner or go to places with air conditioning.  Asthma requires careful medical care. See your health care provider for a follow-up as advised. If you are more than [redacted] weeks pregnant and you were prescribed any new medicines, let your obstetrician know about the visit and how you are doing. Follow up with your health care provider as directed.  After you have recovered from your asthma attack, make an appointment with your outpatient doctor to talk about ways to reduce the likelihood of  future attacks. If you do not have a doctor who manages your asthma, make an appointment with a primary care doctor to discuss your asthma. SEEK IMMEDIATE MEDICAL CARE IF:   You are getting worse.  You have trouble breathing. If severe, call your local emergency  services (911 in the [place]  You develop chest pain or discomfort.  You are vomiting.  You are not able to keep fluids down.  You are coughing up yellow, green, brown, or bloody sputum.  You have a fever and your symptoms suddenly get worse.  You have trouble swallowing. MAKE SURE YOU:   Understand these instructions.  Will watch your condition.  Will get help right away if you are not doing well or get worse.   This information is not intended to replace advice given to you by your health care provider. Make sure you discuss any questions you have with your health care provider.   Document Released: 11/12/2006 Document Revised: 08/02/2013 Document Reviewed: 02/02/2013 Elsevier Interactive Patient Education [employer].

## 2015-11-22 NOTE — ED Notes (Signed)
PA at bedside.

## 2015-11-22 NOTE — ED Provider Notes (Signed)
CSN: [id number]     Arrival date & time 11/22/15  2006 History   First MD Initiated Contact with Patient 11/22/15 2220     Chief Complaint  Patient presents with  . Asthma     (Consider location/radiation/quality/duration/timing/severity/associated sxs/prior Treatment) HPI   55 year old male who presents for evaluation of shortness of breath. Patient states he used has history of asthma which has resolved for many years. However for the past 6 weeks he has had recurrent shortness of breath usually worsen at nighttime. 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 He noticed worsening symptoms whenever he takes hot shower. Furthermore, he also reported having nasal congestion secondary to a fleshy tissue inside his nose most significant on the left side that he has had for the past several months. Patient states he had had it excised in the past by ENT provider but it has returned. He is having a difficult time breathing through his nose. He denies any significant greenish discharge or facial pain. Denies having fever, chills, hemoptysis, abnormal weight changes, or night sweats. He is not a smoker but admits to chewing tobacco for more than 30 years. Patient also has significant family history of cardiac disease. He denies any prior history of PE or DVT, no recent surgery, prolonged bed rest, unilateral leg swelling or calf pain, active cancer, or hemoptysis.    Past Medical History  Diagnosis Date  . Renal disorder     kidney stones   Past Surgical History  Procedure Laterality Date  . Extracorporeal shock wave lithotripsy    . Uretheral stent placement    . Cystoscopy/retrograde/ureteroscopy/stone extraction with basket Left 09/09/2013    Procedure: CYSTOSCOPY/LEFT RETROGRADE PYELOGRAM /FLEXIBLE URETEROSCOPY/INSERTION LEFT URETERAL  STENT;  Surgeon: Ardis Hughs, MD;  Location: [facility];  Service: Urology;  Laterality: Left;   History reviewed. No pertinent family history. Social History  Substance Use Topics  . Smoking status: Never Smoker   . Smokeless tobacco: Never Used  . Alcohol Use: Yes     Comment: occ.    Review of Systems  All other systems reviewed and are negative.     Allergies  Peanuts  Home Medications   Prior to Admission medications   Medication Sig Start Date End Date Taking? Authorizing Provider  amoxicillin (AMOXIL) 500 MG capsule Take 1 capsule (500 mg total) by mouth 3 (three) times daily. 09/15/15   Tanna Furry, MD  fluticasone Asencion Islam) 50 MCG/ACT nasal spray 1 spray each nares twice a day 09/15/15   Tanna Furry, MD  OVER THE COUNTER MEDICATION Dayquil    Historical Provider, MD  Phenyleph-Doxylamine-DM-APAP (ALKA-SELTZER PLS NIGHT CLD/FLU) 5-6.25-10-325 MG CAPS Take 1 tablet by mouth 2 (two) times daily as needed (cold symptoms).    Historical Provider, MD   BP 119/82 mmHg  Pulse 87  Temp(Src) 98 F (36.7 C) (Oral)  Resp 20  Ht 5\' 6"  (1.676 m)  Wt 64.411 kg  BMI 22.93 kg/m2  SpO2 92% Physical Exam  Constitutional: He is oriented to person, place, and time. He appears well-developed and well-nourished. No distress.  Thin Caucasian male in no acute discomfort.  HENT:  Head: Atraumatic.  Ears: Normal TMs bilaterally Nose: A fleshy polyps noted to right nares approximately 4 mm in diameter. A larger fleshy mass noted in the left nares approximately 1 cm in  diameter obstructing the nares. Throat: Uvula is midline no tonsillar enlargement or exudates, no trismus.  Eyes: Conjunctivae are normal.  Neck: Neck supple. No tracheal deviation present.  Cardiovascular: Normal rate, regular rhythm and intact distal pulses.  Exam reveals no gallop and no friction rub.   No murmur heard. Pulmonary/Chest: Effort normal. No stridor. He has wheezes (Faint expiratory wheezes and mild rhonchi  but no rales.). He has no rales.  Abdominal: Soft. There is no tenderness.  Musculoskeletal: He exhibits no edema.  Neurological: He is alert and oriented to person, place, and time.  Skin: No rash noted.  Psychiatric: He has a normal mood and affect.  Nursing note and vitals reviewed.   ED Course  Procedures (including critical care time) Labs Review Labs Reviewed - No data to display  Imaging Review Dg Chest 2 View  11/22/2015  CLINICAL DATA:  Shortness of breath and cough for 3 weeks EXAM: CHEST  2 VIEW COMPARISON:  April 17, 2006 FINDINGS: There is no edema or consolidation. Heart size and pulmonary vascularity are normal. No adenopathy. No bone lesions. IMPRESSION: No edema or consolidation. Electronically Signed   By: Lowella Grip III M.D.   On: 11/22/2015 20:59   I have personally reviewed and evaluated these images and lab results as part of my medical decision-making.   EKG Interpretation None      MDM   Final diagnoses:  Bronchospasm  Multiple nasal polyps    BP 107/75 mmHg  Pulse 79  Temp(Src) 98 F (36.7 C) (Oral)  Resp 15  Ht 5\' 6"  (1.676 m)  Wt 64.411 kg  BMI 22.93 kg/m2  SpO2 96%   10:47 PM Patient here with recurrent shortness of breath at nighttime along with wheezing. Remote history of asthma. Patient works on the farm. When he first came in he was having trouble breathing but after receiving a breathing treatment patient felt much better. On exam he does have some mild expiratory wheezes but appears to be in no acute respiratory distress. When ambulating his O2 sat stays above 90. His chest x-ray shows no acute focal infiltrate concerning for pneumonia. Suspect bronchospasm causing symptoms. I will provide appropriate treatment and encouraged patient to follow-up outpatient for further care.  Furthermore, patient has several fleshy polyps in his nares. He does not have a significant history of allergic rhinitis. He is a tobacco user which does  increase the risk of cancer. Patient will need to follow with ENT provider for further management of his nose complaint.  Domenic Moras, PA-C 11/22/15 [id number]  Lacretia Leigh, MD 11/26/15 [phone]

## 2015-12-11 ENCOUNTER — Ambulatory Visit (INDEPENDENT_AMBULATORY_CARE_PROVIDER_SITE_OTHER): Payer: Medicare Other | Admitting: Family Medicine

## 2015-12-11 ENCOUNTER — Encounter: Payer: Self-pay | Admitting: Family Medicine

## 2015-12-11 VITALS — BP 118/66 | HR 72 | Temp 97.9°F | Ht 66.0 in | Wt 142.0 lb

## 2015-12-11 DIAGNOSIS — Z8249 Family history of ischemic heart disease and other diseases of the circulatory system: Secondary | ICD-10-CM

## 2015-12-11 DIAGNOSIS — Z114 Encounter for screening for human immunodeficiency virus [HIV]: Secondary | ICD-10-CM | POA: Diagnosis not present

## 2015-12-11 DIAGNOSIS — Z79899 Other long term (current) drug therapy: Secondary | ICD-10-CM | POA: Diagnosis not present

## 2015-12-11 DIAGNOSIS — J45909 Unspecified asthma, uncomplicated: Secondary | ICD-10-CM

## 2015-12-11 DIAGNOSIS — J329 Chronic sinusitis, unspecified: Secondary | ICD-10-CM | POA: Diagnosis not present

## 2015-12-11 DIAGNOSIS — Z1159 Encounter for screening for other viral diseases: Secondary | ICD-10-CM

## 2015-12-11 DIAGNOSIS — Z0000 Encounter for general adult medical examination without abnormal findings: Secondary | ICD-10-CM

## 2015-12-11 DIAGNOSIS — G90519 Complex regional pain syndrome I of unspecified upper limb: Secondary | ICD-10-CM | POA: Insufficient documentation

## 2015-12-11 DIAGNOSIS — N200 Calculus of kidney: Secondary | ICD-10-CM | POA: Diagnosis not present

## 2015-12-11 LAB — COMPLETE METABOLIC PANEL WITH GFR
ALBUMIN: 3.9 g/dL (ref 3.6–5.1)
ALK PHOS: 68 U/L (ref 40–115)
ALT: 14 U/L (ref 9–46)
AST: 15 U/L (ref 10–35)
BILIRUBIN TOTAL: 0.5 mg/dL (ref 0.2–1.2)
BUN: 13 mg/dL (ref 7–25)
CALCIUM: 8.5 mg/dL — AB (ref 8.6–10.3)
CO2: 25 mmol/L (ref 20–31)
Chloride: 106 mmol/L (ref 98–110)
Creat: 0.86 mg/dL (ref 0.70–1.33)
GFR, Est African American: >89 mL/min (ref >=60)
Glucose, Bld: 101 mg/dL — ABNORMAL HIGH (ref 65–99)
POTASSIUM: 4.2 mmol/L (ref 3.5–5.3)
Sodium: 137 mmol/L (ref 135–146)
TOTAL PROTEIN: 5.8 g/dL — AB (ref 6.1–8.1)

## 2015-12-11 LAB — LIPID PANEL
CHOL/HDL RATIO: 3 ratio (ref <=5.0)
CHOLESTEROL: 172 mg/dL (ref 125–200)
HDL: 58 mg/dL (ref >=40)
LDL Cholesterol: 68 mg/dL (ref <130)
Triglycerides: 232 mg/dL — ABNORMAL HIGH (ref <150)
VLDL: 46 mg/dL — ABNORMAL HIGH (ref <30)

## 2015-12-11 LAB — CBC
HCT: 39.6 % (ref 38.5–50.0)
Hemoglobin: 13.2 g/dL (ref 13.2–17.1)
MCH: 29.8 pg (ref 27.0–33.0)
MCHC: 33.3 g/dL (ref 32.0–36.0)
MCV: 89.4 fL (ref 80.0–100.0)
MPV: 9.3 fL (ref 7.5–12.5)
PLATELETS: 253 10*3/uL (ref 140–400)
RBC: 4.43 MIL/uL (ref 4.20–5.80)
RDW: 13.8 % (ref 11.0–15.0)
WBC: 6 10*3/uL (ref 3.8–10.8)

## 2015-12-11 MED ORDER — AEROCHAMBER PLUS FLO-VU MEDIUM MISC: 1.0000 | Freq: Once | Status: DC

## 2015-12-11 MED ORDER — BECLOMETHASONE DIPROPIONATE 80 MCG/ACT IN AERS: 1.0000 | INHALATION_SPRAY | Freq: Two times a day (BID) | RESPIRATORY_TRACT | Status: DC

## 2015-12-11 MED ORDER — ALBUTEROL SULFATE HFA 108 (90 BASE) MCG/ACT IN AERS: 2.0000 | INHALATION_SPRAY | Freq: Four times a day (QID) | RESPIRATORY_TRACT | Status: DC | PRN

## 2015-12-11 NOTE — Patient Instructions (Addendum)
Please use the Qvar twice daily. Use this everyday, regardless of your symptoms. Try to use the albuterol only as needed.  We will probably need to do lung tests in the future.  We will check your blood work today.  Please come back in 6 months or sooner, if you need anything else.  Take care,  Dr [name]

## 2015-12-11 NOTE — Progress Notes (Signed)
Subjective:  Jose Hartman is a 55 y.o. male who presents to the [facility] today to establish care.   HPI:  Asthma Patient reports long standing history of asthma. Related to "inhaling steel debris." He has never seen a pulmonologist. Patient does not have a prior PCP. He previously managed his exacerbation by going to the ED. He has been using albuterol inhaler twice daily for the past several weeks. No cough. No wheeze. Has some shortness of breath at baseline. Shortness of breath is worse when around allergens such as dust, pollen, etc.   ROS: Per HPI, otherwise all systems reviewed and are negative  PMH:  The following were reviewed and entered/updated in epic: Past Medical History  Diagnosis Date  . Renal disorder     kidney stones   Patient Active Problem List   Diagnosis Date Noted  . 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 Recurrent kidney stones 12/11/2015  . Asthma 12/11/2015  . Sinusitis, chronic 12/11/2015   Past Surgical History  Procedure Laterality Date  . Extracorporeal shock wave lithotripsy    . Uretheral stent placement    . Cystoscopy/retrograde/ureteroscopy/stone extraction with basket Left 09/09/2013    Procedure: CYSTOSCOPY/LEFT RETROGRADE PYELOGRAM /FLEXIBLE URETEROSCOPY/INSERTION LEFT URETERAL STENT;  Surgeon: Ardis Hughs, MD;  Location: [facility];  Service: Urology;  Laterality: Left;   Family History Heart disease T2DM Esophageal Cancer  Medications- reviewed and updated Current Outpatient Prescriptions  Medication Sig Dispense Refill  . albuterol (PROVENTIL HFA;VENTOLIN HFA) 108 (90 Base) MCG/ACT inhaler Inhale 2 puffs into the lungs every 6 (six) hours as needed for wheezing or shortness of breath. 1 Inhaler 2  . beclomethasone (QVAR) 80 MCG/ACT inhaler Inhale 1 puff into the lungs 2 (two) times daily. 1 Inhaler 12  . fluticasone (FLONASE) 50 MCG/ACT nasal spray 1 spray each nares twice a day (Patient not taking: Reported on 11/22/2015) 10 g 1  .  Spacer/Aero-Holding Chambers (AEROCHAMBER PLUS FLO-VU MEDIUM) MISC 1 each by Other route once. 1 each 0   No current facility-administered medications for this visit.    Allergies-reviewed and updated Allergies  Allergen Reactions  . Benadryl [Diphenhydramine Hcl] Other (See Comments)    Causes him to be hyper  . Peanuts [Peanut Oil] Hives    Makes ears red and burn     Social History   Social History  . Marital Status: Married    Spouse Name: N/A  . Number of Children: N/A  . Years of Education: N/A   Social History Main Topics  . Smoking status: Never Smoker   . Smokeless tobacco: Never Used  . Alcohol Use: Yes     Comment: occ.  . Drug Use: No  . Sexual Activity: Not Currently   Other Topics Concern  . Not on file   Social History Narrative   Objective:  Physical Exam: BP 118/66 mmHg  Pulse 72  Temp(Src) 97.9 F (36.6 C) (Oral)  Ht 5\' 6"  (1.676 m)  Wt 142 lb (64.411 kg)  BMI 22.93 kg/m2  Gen: NAD, resting comfortably CV: RRR with no murmurs appreciated Pulm: NWOB, CTAB with no crackles, wheezes, or rhonchi GI: Normal bowel sounds present. Soft, Nontender, Nondistended. MSK: no edema, cyanosis, or clubbing noted Skin: warm, dry Neuro: grossly normal, moves all extremities Psych: Normal affect and thought content  Assessment/Plan:  Asthma Unclear what patient's actual underlying pulmonary diagnosis is, as he has never been fully evaluated. Most consistent with asthma given his response to albuterol, and increased shortness  of breath around allergens, though he may have a component of interstitial lung disease given his history of "steel inhalation." Will start Qvar daily for his presumed asthma. Instructed patient to only use albuterol as needed and that it should not be used everyday. Will follow up in 6 months. Will need PFTs.   Algis Greenhouse. [name], [place] Resident PGY-2 12/11/2015 4:55 PM

## 2015-12-11 NOTE — Assessment & Plan Note (Signed)
Unclear what patient's actual underlying pulmonary diagnosis is, as he has never been fully evaluated. Most consistent with asthma given his response to albuterol, and increased shortness of breath around allergens, though he may have a component of interstitial lung disease given his history of "steel inhalation." Will start Qvar daily for his presumed asthma. Instructed patient to only use albuterol as needed and that it should not be used everyday. Will follow up in 6 months. Will need PFTs.

## 2015-12-12 ENCOUNTER — Encounter: Payer: Self-pay | Admitting: Family Medicine

## 2015-12-12 LAB — HEPATITIS C ANTIBODY: HCV AB: NEGATIVE

## 2015-12-12 LAB — HIV ANTIBODY (ROUTINE TESTING W REFLEX): HIV: NONREACTIVE

## 2016-01-16 ENCOUNTER — Ambulatory Visit (INDEPENDENT_AMBULATORY_CARE_PROVIDER_SITE_OTHER): Payer: Medicare Other | Admitting: Family Medicine

## 2016-01-16 ENCOUNTER — Encounter: Payer: Self-pay | Admitting: Family Medicine

## 2016-01-16 VITALS — BP 123/67 | HR 69 | Temp 97.7°F | Wt 144.4 lb

## 2016-01-16 DIAGNOSIS — M549 Dorsalgia, unspecified: Secondary | ICD-10-CM

## 2016-01-16 DIAGNOSIS — M545 Low back pain, unspecified: Secondary | ICD-10-CM

## 2016-01-16 MED ORDER — BACLOFEN 10 MG PO TABS: 10.0000 mg | ORAL_TABLET | Freq: Three times a day (TID) | ORAL | Status: DC

## 2016-01-16 MED ORDER — KETOROLAC TROMETHAMINE 60 MG/2ML IM SOLN
60.0000 mg | Freq: Once | INTRAMUSCULAR | Status: AC
  Administered 2016-01-16: 60 mg via INTRAMUSCULAR

## 2016-01-16 NOTE — Patient Instructions (Addendum)
Your back pain is due to muscle strain. The shot today should help you recover a little faster.  - You can take ibuprofen 600-800mg  (3-4 of the 200mg  pills) every 8 hours (3 times a day) as needed.  - You can take baclofen 10 mg every 8 hours as needed for muscle spasm / pain. Do not drive while taking this medication.   Follow-up in 3-4 weeks or sooner if you develop: numbness or weakness in your legs; trouble with urination; fevers; or severe pain.   Back Pain, Adult Back pain is very common in adults.The cause of back pain is rarely dangerous and the pain often gets better over time.The cause of your back pain may not be known. Some common causes of back pain include:  Strain of the muscles or ligaments supporting the spine.  Wear and tear (degeneration) of the spinal disks.  Arthritis.  Direct injury to the back. For many people, back pain may return. Since back pain is rarely dangerous, most people can learn to manage this condition on their own. HOME CARE INSTRUCTIONS Watch your back pain for any changes. The following actions may help to lessen any discomfort you are feeling:  Remain active. It is stressful on your back to sit or stand in one place for long periods of time. Do not sit, drive, or stand in one place for more than 30 minutes at a time. Take short walks on even surfaces as soon as you are able.Try to increase the length of time you walk each day.  Exercise regularly as directed by your health care provider. Exercise helps your back heal faster. It also helps avoid future injury by keeping your muscles strong and flexible.  Do not stay in bed.Resting more than 1-2 days can delay your recovery.  Pay attention to your body when you bend and lift. The most comfortable positions are those that put less stress on your recovering back. Always use proper lifting techniques, including:  Bending your knees.  Keeping the load close to your body.  Avoiding  twisting.  Find a comfortable position to sleep. Use a firm mattress and lie on your side with your knees slightly bent. If you lie on your back, put a pillow under your knees.  Avoid feeling anxious or stressed.Stress increases muscle tension and can worsen back pain.It is important to recognize when you are anxious or stressed and learn ways to manage it, such as with exercise.  Take medicines only as directed by your health care provider. Over-the-counter medicines to reduce pain and inflammation are often the most helpful.Your health care provider may prescribe muscle relaxant drugs.These medicines help dull your pain so you can more quickly return to your normal activities and healthy exercise.  Apply ice to the injured area:  Put ice in a plastic bag.  Place a towel between your skin and the bag.  Leave the ice on for 20 minutes, 2-3 times a day for the first 2-3 days. After that, ice and heat may be alternated to reduce pain and spasms.  Maintain a healthy weight. Excess weight puts extra stress on your back and makes it difficult to maintain good posture. SEEK MEDICAL CARE IF:  You have pain that is not relieved with rest or medicine.  You have increasing pain going down into the legs or buttocks.  You have pain that does not improve in one week.  You have night pain.  You lose weight.  You have a fever or chills.  SEEK IMMEDIATE MEDICAL CARE IF:   You develop new bowel or bladder control problems.  You have unusual weakness or numbness in your arms or legs.  You develop nausea or vomiting.  You develop abdominal pain.  You feel faint.   This information is not intended to replace advice given to you by your health care provider. Make sure you discuss any questions you have with your health care provider.   Document Released: 07/28/2005 Document Revised: 08/18/2014 Document Reviewed: 11/29/2013 Elsevier Interactive Patient Education [employer].

## 2016-01-16 NOTE — Assessment & Plan Note (Signed)
Left lower lumbar back pain with radiation to thigh / knee without sciatica or signs or infection or neurovascular compromise. Hx of kidney stones but no dysuria, fevers or blood in urine to indicate recurrence. Likely musculoskeletal strain; will treat symptomatically with muscle relaxer and NSAIDs.  - Toradol given in clinic - See AVS for return precautions.

## 2016-01-16 NOTE — Progress Notes (Signed)
   Subjective:    Patient ID: Jose Hartman, male    DOB: [birth year], 55 y.o.   MRN: [id number]  Seen for Same day visit for   CC: back pain  He reports left sided low back pain described as achy for the past two weeks since bailing hay.   BACK PAIN Quality: Achy  Location & Radiation: left lumbar with radiation to left thigh and knee Timing (Onset, Duration, Freq): 2 weeks, daily, constant Worse with: movement  Better with: lying down  Any Trauma: no Hx of kidney stones, but denies blood in urine, dysuria, fevers  Red Flags  Urinary retention: no   Numbness/Weakness: no   Fever/chills/sweats: no   Night pain: no   Unexplained weight loss: no   No relief with bedrest: yes   Hx of cancer/immunosuppression: no   Hx of IV drug use: no   Hx of osteoporosis or chronic steroid use: no   Smoking history noted  Review of Systems   See HPI for ROS. Objective:  BP 123/67 mmHg  Pulse 69  Temp(Src) 97.7 F (36.5 C) (Oral)  Wt 144 lb 6.4 oz (65.499 kg)  General: NAD Cardiac: RRR, normal heart sounds, no murmurs. 2+ radial and PT pulses bilaterally Respiratory: CTAB, normal effort Abdomen: soft, nontender, nondistended,  Bowel sounds present. No CVA tenderness Back: Normal skin w/o rash, Spine with normal alignment and no deformity. No tenderness to vertebral process palpation. Left paraspinous muscles are diffusely tender without spasm. SLR negative. XLR negative. Bilateral lower extremity strength and sensation intact.     Assessment & Plan:   Left LBP Left lower lumbar back pain with radiation to thigh / knee without sciatica or signs or infection or neurovascular compromise. Hx of kidney stones but no dysuria, fevers or blood in urine to indicate recurrence. Likely musculoskeletal strain; will treat symptomatically with muscle relaxer and NSAIDs.  - Toradol given in clinic - See AVS for return precautions.

## 2016-01-16 NOTE — Addendum Note (Signed)
Addended by: Valerie Roys on: 01/16/2016 10:30 AM   Modules accepted: Orders

## 2016-01-24 ENCOUNTER — Other Ambulatory Visit: Payer: Self-pay | Admitting: Family Medicine

## 2016-01-25 NOTE — Telephone Encounter (Signed)
Rx filled. Patient will need appointment if pain is not improving or getting worse.  Jose Hartman. [name], [facility] Resident PGY-2 01/25/2016 9:07 AM

## 2016-02-04 ENCOUNTER — Ambulatory Visit (INDEPENDENT_AMBULATORY_CARE_PROVIDER_SITE_OTHER): Payer: Medicare Other | Admitting: Family Medicine

## 2016-02-04 ENCOUNTER — Encounter: Payer: Self-pay | Admitting: Family Medicine

## 2016-02-04 ENCOUNTER — Telehealth: Payer: Self-pay | Admitting: Family Medicine

## 2016-02-04 VITALS — BP 134/90 | HR 70 | Temp 97.8°F | Ht 66.0 in | Wt 146.0 lb

## 2016-02-04 DIAGNOSIS — M545 Low back pain, unspecified: Secondary | ICD-10-CM

## 2016-02-04 DIAGNOSIS — J339 Nasal polyp, unspecified: Secondary | ICD-10-CM

## 2016-02-04 MED ORDER — FLUTICASONE PROPIONATE 50 MCG/ACT NA SUSP: NASAL | Status: DC

## 2016-02-04 NOTE — Patient Instructions (Signed)
Restart Flonase 4 sprays each nostril twice a day for your likely nasal polyps.  Call if you do not notice any improvement after 1-2 weeks of this and we will discuss oral steroids.  - Call and schedule follow-up with her ENT doctor

## 2016-02-04 NOTE — Assessment & Plan Note (Signed)
Left lower back pain resolved.  Discussed continuing home exercise program to decrease future occurrences

## 2016-02-04 NOTE — Progress Notes (Signed)
  Patient name: Jose Hartman MRN [id number]  Date of birth: November 22, 1960  CC & HPI:  Jose Hartman is a 55 y.o. male presenting today for nasal polyps and LBP.  LBP - He reports complete resolution of his lower back pain - No longer taking baclofen or anti-inflammatory medications  Nasal polyps - He reports history of nasal polyps requiring polypectomy.  - Over the past few weeks he has noticed difficulty breathing from his left nostril with nasal pressure - Reports previous polyps initially improved with steroids - Has Flonase but has not been using it - Denies headache, fevers, chills, shortness of breath - Denies itchy watery eyes, sneezing or ear fullness  Smoking History Noted  Objective Findings:  Vitals: BP 134/90 mmHg  Pulse 70  Temp(Src) 97.8 F (36.6 C) (Oral)  Ht 5\' 6"  (1.676 m)  Wt 146 lb (66.225 kg)  BMI 23.58 kg/m2  Gen: NAD ENT: Obstructed left nostril  CV: RRR w/o m/r/g, pulses +2 b/l Resp: CTAB w/ normal respiratory effort  Assessment & Plan:   Left nasal polyps Likely return of nasal polyps versus swollen nasal turbinates associated with allergies.  History of nasal polyps requiring polypectomy - Restart nasal steroid: Flonase 4 sprays (272mcg) each nostril twice a day - Consider oral steroids if no improvement of 1-2 weeks - Advised reestablishing with ENT, he will call if he needs a new referral   Left LBP Left lower back pain resolved.  Discussed continuing home exercise program to decrease future occurrences

## 2016-02-04 NOTE — Telephone Encounter (Signed)
Medication was sent to this pharmacy during his visit today. Jazmin Hartsell,CMA

## 2016-02-04 NOTE — Assessment & Plan Note (Signed)
Likely return of nasal polyps versus swollen nasal turbinates associated with allergies.  History of nasal polyps requiring polypectomy - Restart nasal steroid: Flonase 4 sprays (265mcg) each nostril twice a day - Consider oral steroids if no improvement of 1-2 weeks - Advised reestablishing with ENT, he will call if he needs a new referral

## 2016-02-04 NOTE — Telephone Encounter (Signed)
Patient wants his prescription to be sent to [facility] on [address]. Please, follow up.

## 2016-02-05 ENCOUNTER — Other Ambulatory Visit: Payer: Self-pay | Admitting: *Deleted

## 2016-02-05 DIAGNOSIS — J339 Nasal polyp, unspecified: Secondary | ICD-10-CM

## 2016-02-05 MED ORDER — FLUTICASONE PROPIONATE 50 MCG/ACT NA SUSP: NASAL | Status: DC

## 2016-02-07 ENCOUNTER — Telehealth: Payer: Self-pay | Admitting: Family Medicine

## 2016-02-07 NOTE — Telephone Encounter (Signed)
Spoke with pharmacist and confirmed dosing is current for patient's nasal spray.  Per multiple mentions of this dosing in his chart. Jazmin Hartsell,CMA

## 2016-02-07 NOTE — Telephone Encounter (Signed)
Wife states that pharmacy has been attempting to get clarification on a RX that was prescribed to patient on 02/04/16 when he saw Dr. Berkley Harvey. Wife states it was some type of steroid but I do not see any meds prescribed by Dr. Berkley Harvey when he saw this patient. I advise wife of this, but she was said pharmacy has a RX from Dr. Berkley Harvey. Please advise.

## 2016-04-16 ENCOUNTER — Other Ambulatory Visit: Payer: Self-pay | Admitting: Family Medicine

## 2016-04-16 ENCOUNTER — Telehealth: Payer: Self-pay | Admitting: *Deleted

## 2016-04-16 DIAGNOSIS — J339 Nasal polyp, unspecified: Secondary | ICD-10-CM

## 2016-04-16 MED ORDER — FLUTICASONE PROPIONATE 50 MCG/ACT NA SUSP: NASAL | 1 refills | Status: DC

## 2016-04-16 NOTE — Progress Notes (Signed)
Patient requested refill of flonase during appointment with his wife today. This was sent in electronically.  Algis Greenhouse. [name], [place] Resident PGY-3 04/16/2016 8:53 AM

## 2016-04-16 NOTE — Telephone Encounter (Signed)
Received fax from [facility] needing clarification for the Flonase directions.  Rx sent for 4 sprays each nares twice a day.  Usually direction is 2 sprays once daily. Please advise.  Derl Barrow, RN

## 2016-04-16 NOTE — Telephone Encounter (Signed)
Patient should use 2 puffs per nostril each day.  Jose Hartman. Jose Hartman, [place] Resident PGY-3 04/16/2016 1:47 PM

## 2017-05-15 ENCOUNTER — Encounter (HOSPITAL_COMMUNITY)
Admission: EM | Disposition: A | Discharge status: Home / Self Care | Payer: Self-pay | Source: Home / Self Care | Attending: Emergency Medicine

## 2017-05-15 ENCOUNTER — Encounter (HOSPITAL_COMMUNITY): Payer: Self-pay | Admitting: Emergency Medicine

## 2017-05-15 ENCOUNTER — Emergency Department (HOSPITAL_COMMUNITY)
Admission: EM | Admit: 2017-05-15 | Discharge: 2017-05-16 | Disposition: A | Discharge status: Home / Self Care | Payer: Medicare Other | Attending: Emergency Medicine | Admitting: Emergency Medicine

## 2017-05-15 DIAGNOSIS — Z91010 Allergy to peanuts: Secondary | ICD-10-CM | POA: Diagnosis not present

## 2017-05-15 DIAGNOSIS — T18128A Food in esophagus causing other injury, initial encounter: Secondary | ICD-10-CM | POA: Insufficient documentation

## 2017-05-15 DIAGNOSIS — K222 Esophageal obstruction: Secondary | ICD-10-CM | POA: Insufficient documentation

## 2017-05-15 DIAGNOSIS — K209 Esophagitis, unspecified: Secondary | ICD-10-CM | POA: Insufficient documentation

## 2017-05-15 DIAGNOSIS — T189XXA Foreign body of alimentary tract, part unspecified, initial encounter: Secondary | ICD-10-CM | POA: Diagnosis present

## 2017-05-15 DIAGNOSIS — J45909 Unspecified asthma, uncomplicated: Secondary | ICD-10-CM | POA: Diagnosis not present

## 2017-05-15 DIAGNOSIS — T182XXA Foreign body in stomach, initial encounter: Secondary | ICD-10-CM | POA: Diagnosis not present

## 2017-05-15 DIAGNOSIS — K228 Other specified diseases of esophagus: Secondary | ICD-10-CM | POA: Diagnosis not present

## 2017-05-15 DIAGNOSIS — T183XXA Foreign body in small intestine, initial encounter: Secondary | ICD-10-CM | POA: Insufficient documentation

## 2017-05-15 DIAGNOSIS — X58XXXA Exposure to other specified factors, initial encounter: Secondary | ICD-10-CM | POA: Diagnosis not present

## 2017-05-15 HISTORY — PX: ESOPHAGOGASTRODUODENOSCOPY: SHX5428

## 2017-05-15 SURGERY — EGD (ESOPHAGOGASTRODUODENOSCOPY)
Anesthesia: General | Laterality: Left

## 2017-05-15 MED ORDER — LIDOCAINE 2% (20 MG/ML) 5 ML SYRINGE
INTRAMUSCULAR | Status: AC
  Filled 2017-05-15: qty 5

## 2017-05-15 MED ORDER — GLUCAGON HCL RDNA (DIAGNOSTIC) 1 MG IJ SOLR
1.0000 mg | Freq: Once | INTRAMUSCULAR | Status: AC
  Administered 2017-05-15: 1 mg via INTRAVENOUS
  Filled 2017-05-15: qty 1

## 2017-05-15 MED ORDER — GLUCAGON HCL RDNA (DIAGNOSTIC) 1 MG IJ SOLR
INTRAMUSCULAR | Status: AC
  Filled 2017-05-15: qty 1

## 2017-05-15 MED ORDER — PROPOFOL 10 MG/ML IV BOLUS
INTRAVENOUS | Status: AC
  Filled 2017-05-15: qty 20

## 2017-05-15 MED ORDER — FENTANYL CITRATE (PF) 100 MCG/2ML IJ SOLN
INTRAMUSCULAR | Status: AC
  Filled 2017-05-15: qty 2

## 2017-05-15 MED ORDER — LORAZEPAM 2 MG/ML IJ SOLN
1.0000 mg | Freq: Once | INTRAMUSCULAR | Status: AC
  Administered 2017-05-15: 1 mg via INTRAVENOUS
  Filled 2017-05-15: qty 1

## 2017-05-15 MED ORDER — NITROGLYCERIN 0.4 MG SL SUBL
0.4000 mg | SUBLINGUAL_TABLET | Freq: Once | SUBLINGUAL | Status: AC
  Administered 2017-05-15: 0.4 mg via SUBLINGUAL
  Filled 2017-05-15: qty 1

## 2017-05-15 MED ORDER — ONDANSETRON HCL 4 MG/2ML IJ SOLN
INTRAMUSCULAR | Status: AC
  Filled 2017-05-15: qty 2

## 2017-05-15 MED ORDER — NITROGLYCERIN 0.4 MG SL SUBL
SUBLINGUAL_TABLET | SUBLINGUAL | Status: AC
  Filled 2017-05-15: qty 1

## 2017-05-15 NOTE — H&P (Signed)
Jose Hartman is an 56 y.o. male.   Chief Complaint: Esophageal food impaction HPI: 56 year old male was in his usual state of health until 6:30 pm today when he felt his food stuck in his lower chest. He was eating liver and was not able to wash it down, no matter what fluid he drank. Since then he has not been able to eat or drink anything further without regurgitating it back up. He c/o difficulty swallowing solids not liquids for all his life but has never seen medical attention for it. He denies weight loss, acid reflux, change in bowel habits, black stools or bloody BM. His brother had history of esophageal cancer. No prior EGD or colonoscopy.  Past Medical History:  Diagnosis Date  . Renal disorder    kidney stones    Past Surgical History:  Procedure Laterality Date  . CYSTOSCOPY/RETROGRADE/URETEROSCOPY/STONE EXTRACTION WITH BASKET Left 09/09/2013   Procedure: CYSTOSCOPY/LEFT RETROGRADE PYELOGRAM /FLEXIBLE URETEROSCOPY/INSERTION LEFT URETERAL STENT;  Surgeon: Ardis Hughs, MD;  Location: [facility];  Service: Urology;  Laterality: Left;  . EXTRACORPOREAL SHOCK WAVE LITHOTRIPSY    . Uretheral Stent placement      Family History  Problem Relation Age of Onset  . Heart disease Unknown   . Diabetes Mellitus II Unknown   . Esophageal cancer Brother    Social History:  reports that he has never smoked. He has never used smokeless tobacco. He reports that he drinks alcohol. He reports that he does not use drugs.  Allergies:  Allergies  Allergen Reactions  . Benadryl [Diphenhydramine Hcl] Other (See Comments)    Causes him to be hyper  . Peanuts [Peanut Oil] Hives    Makes ears red and burn      (Not in a hospital admission)  No results found for this or any previous visit (from the past 48 hour(s)). No results found.  Review of Systems  Constitutional: Negative.   HENT: Negative.   Eyes: Negative.   Respiratory: Negative.   Cardiovascular: Negative.    Gastrointestinal: Negative.   Genitourinary: Negative.   Musculoskeletal: Positive for joint pain.  Skin: Negative.   Neurological: Negative.   Endo/Heme/Allergies: Negative.   Psychiatric/Behavioral: Negative.     Blood pressure 118/75, pulse 78, temperature 98.5 F (36.9 C), temperature source Oral, resp. rate 20, height 5\' 6"  (1.676 m), weight 63.5 kg (140 lb), SpO2 97 %. Physical Exam   General: able to speak in full sentences, not able to swallow, has been spitting clear fluid in a bag at bedside EYES: no pallor, no icterus ABDOMEN: soft, non distended, non tender, BS normoactive EXTREMITIES: no edema, no deformity CHEST: clear to auscultation bilaterally, normal VBS HEART: normal S1S2, RRR NEURO: AAOX3, no FND  Assessment/Plan Esophageal food bolus impaction. Long standing dysphagia?stricture vs eosinophilic esophagitis.   EGD now. The risks(including but not limited to perforation, infection) and benefits of the procedure were explained to the patient in details. He understands and verbalizes consent.  Ronnette Juniper, MD 05/15/2017, 11:56 PM

## 2017-05-15 NOTE — Discharge Instructions (Signed)
General Anesthesia, Adult, Care After °These instructions provide you with information about caring for yourself after your procedure. Your health care provider may also give you more specific instructions. Your treatment has been planned according to current medical practices, but problems sometimes occur. Call your health care provider if you have any problems or questions after your procedure. °What can I expect after the procedure? °After the procedure, it is common to have: °· Vomiting. °· A sore throat. °· Mental slowness. ° °It is common to feel: °· Nauseous. °· Cold or shivery. °· Sleepy. °· Tired. °· Sore or achy, even in parts of your body where you did not have surgery. ° °Follow these instructions at home: °For at least 24 hours after the procedure: °· Do not: °? Participate in activities where you could fall or become injured. °? Drive. °? Use heavy machinery. °? Drink alcohol. °? Take sleeping pills or medicines that cause drowsiness. °? Make important decisions or sign legal documents. °? Take care of children on your own. °· Rest. °Eating and drinking °· If you vomit, drink water, juice, or soup when you can drink without vomiting. °· Drink enough fluid to keep your urine clear or pale yellow. °· Make sure you have little or no nausea before eating solid foods. °· Follow the diet recommended by your health care provider. °General instructions °· Have a responsible adult stay with you until you are awake and alert. °· Return to your normal activities as told by your health care provider. Ask your health care provider what activities are safe for you. °· Take over-the-counter and prescription medicines only as told by your health care provider. °· If you smoke, do not smoke without supervision. °· Keep all follow-up visits as told by your health care provider. This is important. °Contact a health care provider if: °· You continue to have nausea or vomiting at home, and medicines are not helpful. °· You  cannot drink fluids or start eating again. °· You cannot urinate after 8-12 hours. °· You develop a skin rash. °· You have fever. °· You have increasing redness at the site of your procedure. °Get help right away if: °· You have difficulty breathing. °· You have chest pain. °· You have unexpected bleeding. °· You feel that you are having a life-threatening or urgent problem. °This information is not intended to replace advice given to you by your health care provider. Make sure you discuss any questions you have with your health care provider. °Document Released: 11/03/2000 Document Revised: 12/31/2015 Document Reviewed: 07/12/2015 °Elsevier Interactive Patient Education © 2018 Elsevier Inc[employer] ° °

## 2017-05-15 NOTE — ED Provider Notes (Signed)
Emergency Department Provider Note   I have reviewed the triage vital signs and the nursing notes.   HISTORY  Chief Complaint Swallowed Foreign Body   HPI Jose Hartman is a 56 y.o. male with a past medical history of dysphagia presents to the emergency department tonight with acute onset of epigastric pain after eating liver and feel like it got stuck in his throat. He has not been ill with a swallowing basis and has had multiple episodes of vomiting. He also has persistent pain. He does not handle his secretions either.He states he's had symptoms of this before usually make himself throw up and gets better however this time it did not. He's never been scoped in the past and never has figured out what the problem was. No shortness of breath, abdominal pain, back pain fevers, cough or other symptoms. No modifying symptoms. No exacerbating symptoms.   Past Medical History:  Diagnosis Date  . Renal disorder    kidney stones    Patient Active Problem List   Diagnosis Date Noted  . Left nasal polyps 02/04/2016  . Left LBP 01/16/2016  . 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 Recurrent kidney stones 12/11/2015  . Asthma 12/11/2015  . Sinusitis, chronic 12/11/2015    Past Surgical History:  Procedure Laterality Date  . CYSTOSCOPY/RETROGRADE/URETEROSCOPY/STONE EXTRACTION WITH BASKET Left 09/09/2013   Procedure: CYSTOSCOPY/LEFT RETROGRADE PYELOGRAM /FLEXIBLE URETEROSCOPY/INSERTION LEFT URETERAL STENT;  Surgeon: Ardis Hughs, MD;  Location: [facility];  Service: Urology;  Laterality: Left;  . EXTRACORPOREAL SHOCK WAVE LITHOTRIPSY    . Uretheral Stent placement      Current Outpatient Rx  . Order #: [id number] Class: Normal  . Order #: [id number] Class: Normal  . Order #: [id number] Class: Normal  . Order #: [id number] Class: Normal    Allergies Benadryl [diphenhydramine hcl] and Peanuts [peanut oil]  Family History  Problem Relation Age of Onset  . Heart disease Unknown   .  Diabetes Mellitus II Unknown   . Esophageal cancer Brother     Social History Social History  Substance Use Topics  . Smoking status: Never Smoker  . Smokeless tobacco: Never Used  . Alcohol use Yes     Comment: occ.    Review of Systems  All other systems negative except as documented in the HPI. All pertinent positives and negatives as reviewed in the HPI. ____________________________________________   PHYSICAL EXAM:  VITAL SIGNS: ED Triage Vitals  Enc Vitals Group     BP 05/15/17 2025 139/89     Pulse Rate 05/15/17 2025 80     Resp 05/15/17 2025 18     Temp 05/15/17 2025 98.5 F (36.9 C)     Temp Source 05/15/17 2025 Oral     SpO2 05/15/17 2025 100 %     Weight 05/15/17 2025 140 lb (63.5 kg)     Height 05/15/17 2025 5\' 6"  (1.676 m)     Head Circumference --      Peak Flow --      Pain Score 05/15/17 2033 0     Pain Loc --      Pain Edu? --      Excl. in [place]? --     Constitutional: Alert and oriented. Well appearing and in no acute distress. Eyes: Conjunctivae are normal. PERRL. EOMI. Head: Atraumatic. Nose: No congestion/rhinnorhea. Mouth/Throat: Mucous membranes are moist.  Oropharynx non-erythematous. Neck: No stridor.  No meningeal signs.   Cardiovascular: Normal rate, regular rhythm. Good peripheral circulation. Grossly normal heart sounds.  Respiratory: Normal respiratory effort.  No retractions. Lungs CTAB. Gastrointestinal: Soft and nontender. No distention.  Musculoskeletal: No lower extremity tenderness nor edema. No gross deformities of extremities. Neurologic:  Normal speech and language. No gross focal neurologic deficits are appreciated.  Skin:  Skin is warm, dry and intact. No rash noted.   ____________________________________________   LABS (all labs ordered are listed, but only abnormal results are displayed)  Labs Reviewed - No data to display ____________________________________________  RADIOLOGY  No results  found.  ____________________________________________   PROCEDURES  Procedure(s) performed:   Procedures  ____________________________________________   INITIAL IMPRESSION / ASSESSMENT AND PLAN / ED COURSE  Pertinent labs & imaging results that were available during my care of the patient were reviewed by me and considered in my medical decision making (see chart for details).  I suspect esophageal impaction, not improved with meds. Will d/w GI.   Gi to see and scope.   ____________________________________________  FINAL CLINICAL IMPRESSION(S) / ED DIAGNOSES  Final diagnoses:  Esophageal obstruction due to food impaction     MEDICATIONS GIVEN DURING THIS VISIT:  Medications  glucagon (human recombinant) (GLUCAGEN) injection 1 mg (1 mg Intravenous Given 05/15/17 2155)  nitroGLYCERIN (NITROSTAT) SL tablet 0.4 mg (0.4 mg Sublingual Given 05/15/17 2153)  LORazepam (ATIVAN) injection 1 mg (1 mg Intravenous Given 05/15/17 2155)     NEW OUTPATIENT MEDICATIONS STARTED DURING THIS VISIT:  New Prescriptions   No medications on file    Note:  This document was prepared using Dragon voice recognition software and may include unintentional dictation errors.   Rubbie Goostree, Corene Cornea, MD 05/16/17 0001

## 2017-05-15 NOTE — ED Triage Notes (Addendum)
Patient reports eating beef and livers for dinner and getting a piece "stuck in his throat." Reports hx of same. States he is unable to keep anything down since that time. Denies SOB and difficulty breathing. Speaking in full sentences.

## 2017-05-15 NOTE — ED Notes (Addendum)
Pt is able to speak in full sentences. Pt is unable to swallow his saliva at this time and has an emesis bag that he is spitting into. CAOX4.

## 2017-05-16 ENCOUNTER — Emergency Department (HOSPITAL_COMMUNITY): Payer: Medicare Other | Admitting: Certified Registered Nurse Anesthetist

## 2017-05-16 ENCOUNTER — Encounter (HOSPITAL_COMMUNITY): Payer: Self-pay

## 2017-05-16 MED ORDER — ALBUTEROL SULFATE (2.5 MG/3ML) 0.083% IN NEBU: 3.0000 mL | INHALATION_SOLUTION | Freq: Four times a day (QID) | RESPIRATORY_TRACT | Status: DC | PRN

## 2017-05-16 MED ORDER — LACTATED RINGERS IV SOLN
INTRAVENOUS | Status: DC | PRN
  Administered 2017-05-16: via INTRAVENOUS

## 2017-05-16 MED ORDER — PANTOPRAZOLE SODIUM 40 MG PO TBEC: 40.0000 mg | DELAYED_RELEASE_TABLET | Freq: Every day | ORAL | Status: DC

## 2017-05-16 MED ORDER — AEROCHAMBER PLUS FLO-VU MEDIUM MISC
1.0000 | Freq: Once | Status: DC
  Filled 2017-05-16: qty 1

## 2017-05-16 MED ORDER — FLUTICASONE PROPIONATE 50 MCG/ACT NA SUSP: 2.0000 | Freq: Every day | NASAL | Status: DC | PRN

## 2017-05-16 MED ORDER — HYDROMORPHONE HCL-NACL 0.5-0.9 MG/ML-% IV SOSY: 0.2500 mg | PREFILLED_SYRINGE | INTRAVENOUS | Status: DC | PRN

## 2017-05-16 MED ORDER — SUCCINYLCHOLINE CHLORIDE 200 MG/10ML IV SOSY
PREFILLED_SYRINGE | INTRAVENOUS | Status: DC | PRN
  Administered 2017-05-16: 160 mg via INTRAVENOUS

## 2017-05-16 MED ORDER — PROPOFOL 10 MG/ML IV BOLUS
INTRAVENOUS | Status: DC | PRN
  Administered 2017-05-16: 170 mg via INTRAVENOUS

## 2017-05-16 MED ORDER — ONDANSETRON HCL 4 MG/2ML IJ SOLN
4.0000 mg | Freq: Once | INTRAMUSCULAR | Status: DC | PRN
Start: 2017-05-16 — End: 2017-05-16

## 2017-05-16 MED ORDER — PHENYLEPHRINE HCL 10 MG/ML IJ SOLN
INTRAMUSCULAR | Status: DC | PRN
  Administered 2017-05-16 (×2): 40 ug via INTRAVENOUS
  Administered 2017-05-16: 80 ug via INTRAVENOUS
  Administered 2017-05-16: 40 ug via INTRAVENOUS
  Administered 2017-05-16: 80 ug via INTRAVENOUS

## 2017-05-16 MED ORDER — FENTANYL CITRATE (PF) 100 MCG/2ML IJ SOLN
INTRAMUSCULAR | Status: DC | PRN
  Administered 2017-05-16: 50 ug via INTRAVENOUS

## 2017-05-16 MED ORDER — OXYCODONE HCL 5 MG PO TABS: 5.0000 mg | ORAL_TABLET | Freq: Once | ORAL | Status: DC | PRN

## 2017-05-16 MED ORDER — OXYCODONE HCL 5 MG/5ML PO SOLN
5.0000 mg | Freq: Once | ORAL | Status: DC | PRN
  Filled 2017-05-16: qty 5

## 2017-05-16 MED ORDER — ONDANSETRON HCL 4 MG/2ML IJ SOLN
INTRAMUSCULAR | Status: DC | PRN
  Administered 2017-05-16: 4 mg via INTRAVENOUS

## 2017-05-16 MED ORDER — BECLOMETHASONE DIPROPIONATE 80 MCG/ACT IN AERS: 1.0000 | INHALATION_SPRAY | Freq: Two times a day (BID) | RESPIRATORY_TRACT | Status: DC | PRN

## 2017-05-16 NOTE — Op Note (Signed)
[facility] Patient Name: Jose Hartman Procedure Date: 05/16/2017 MRN: [id number] Attending MD: Ronnette Juniper , MD Date of Birth: 06-Dec-1960 CSN: [id number] Age: 56 Admit Type: Inpatient Procedure:                Upper GI endoscopy Indications:              Dysphagia, Foreign body in the esophagus,                            esophageal food bolus impaction Providers:                Ronnette Juniper, MD, Cherylynn Ridges, Technician Referring MD:              Medicines:                Monitored Anesthesia Care Complications:            No immediate complications. Estimated Blood Loss:     Estimated blood loss: none. Procedure:                Pre-Anesthesia Assessment:                           - Prior to the procedure, a History and Physical                            was performed, and patient medications and                            allergies were reviewed. The patient's tolerance of                            previous anesthesia was also reviewed. The risks                            and benefits of the procedure and the sedation                            options and risks were discussed with the patient.                            All questions were answered, and informed consent                            was obtained. Prior Anticoagulants: The patient has                            taken no previous anticoagulant or antiplatelet                            agents. ASA Grade Assessment: II - A patient with                            mild systemic disease. After reviewing the risks  and benefits, the patient was deemed in                            satisfactory condition to undergo the procedure.                           After obtaining informed consent, the endoscope was                            passed under direct vision. Throughout the                            procedure, the patient's blood pressure, pulse, and    oxygen saturations were monitored continuously. The                            EG-[id number] ([id number]) scope was introduced through the                            mouth, and advanced to the second part of duodenum.                            The upper GI endoscopy was accomplished without                            difficulty. The patient tolerated the procedure                            well. Scope In: Scope Out: Findings:      Impacted food was found in the distal esophagus at 40 cm from       insertion.Clear fluid was noted in mid esophagus above the area of       impaction which was suctioned. Removal of food was accomplished wtih a       grasping forceps.      Severe mucosal changes characterized by erythema, friability (with       contact bleeding), granularity, inflammation and nodularity were found       in the distal esophagus at 40 cm with a narrowed lumen. Biopsies were       taken with a cold forceps for histology.      The scope could be advanced in to the gastric cavity post food       disimpaction with minimal resistance.      A medium amount of food (residue) was found in the gastric body.      The cardia and gastric fundus were normal on retroflexion.      Food (residue) was found in the duodenal bulb.      The first portion of the duodenum and second portion of the duodenum       were normal. Impression:               - Food in the distal esophagus. Removal was                            successful.                           -  Erythematous, friable (with contact bleeding),                            granular, inflamed, nodular mucosa in the                            esophagus. Biopsied.                           - A medium amount of food (residue) in the stomach.                           - Retained food in the duodenum.                           - Normal first portion of the duodenum and second                            portion of the duodenum. Moderate Sedation:       Patient did not receive moderate sedation for this procedure, but       instead received monitored anesthesia care. Recommendation:           - Await pathology results.                           - Mechanical soft diet.                           - Use Protonix (pantoprazole) 40 mg PO daily for 8                            weeks.                           - Continue present medications.                           - Discharge patient to home.                           - Patient has a contact number available for                            emergencies. The signs and symptoms of potential                            delayed complications were discussed with the                            patient. Return to normal activities tomorrow.                            Written discharge instructions were provided to the                            patient.                           -  Patient has a contact number available for                            emergencies. The signs and symptoms of potential                            delayed complications were discussed with the                            patient. Return to normal activities tomorrow.                            Written discharge instructions were provided to the                            patient. Procedure Code(s):        --- Professional ---                           [phone], Esophagogastroduodenoscopy, flexible,                            transoral; with removal of foreign body(s)                           43239, Esophagogastroduodenoscopy, flexible,                            transoral; with biopsy, single or multiple Diagnosis Code(s):        --- Professional ---                           G31.517O, Food in esophagus causing other injury,                            initial encounter                           T18.3XXA, Foreign body in small intestine, initial                            encounter                           K20.9,  Esophagitis, unspecified                           K22.8, Other specified diseases of esophagus                           T18.2XXA, Foreign body in stomach, initial encounter                           R13.10, Dysphagia, unspecified                           T18.108A, Unspecified foreign body in esophagus  causing other injury, initial encounter CPT copyright 2016 [employer]. All rights reserved. The codes documented in this report are preliminary and upon coder review may  be revised to meet current compliance requirements. Ronnette Juniper, MD 05/16/2017 1:18:58 AM This report has been signed electronically. Number of Addenda: 0

## 2017-05-16 NOTE — Anesthesia Postprocedure Evaluation (Signed)
Anesthesia Post Note  Patient: Jose Hartman  Procedure(s) Performed: ESOPHAGOGASTRODUODENOSCOPY (EGD) (Left )     Patient location during evaluation: PACU Anesthesia Type: General Level of consciousness: awake and sedated Pain management: pain level controlled Respiratory status: spontaneous breathing, respiratory function stable and nonlabored ventilation Cardiovascular status: blood pressure returned to baseline Anesthetic complications: no    Last Vitals:  Vitals:   05/15/17 2240 05/16/17 0100  BP: 118/75 124/77  Pulse: 78 83  Resp: 20 (!) (P) 22  Temp:  36.9 C  SpO2: 97% 100%    Last Pain:  Vitals:   05/15/17 2150  TempSrc:   PainSc: 8                  Lulu Hirschmann COKER

## 2017-05-16 NOTE — Op Note (Signed)
EGD performed for food bolus impaction.  There was clear fluid noted in the mid esophagus. At 40 cm from insertion, food was noted- impacted in a narrowed lumen which could not be pushed in to the gastric cavity. It was removed with a grasping forceps. The distal esophagus appeared edematous,nodular, friable, bleeding post removal of food impaction. Biopsies were taken from the distal esophagus to r/o malignancy. There was fluid and semisolid food noted in proximal gastric cavity. Retroflexion did not reveal obvious abnormalities. Food was also noted in duodenal bulb, rest of the duodenum appeared normal.  Recommendations: OK to d/c home from GI standpoint. Start mechanical soft diet. F/U in my office in 1 week. F/U pathology as an outpatient. May need dilation soon,depending on pathology.   Ronnette Juniper, MD

## 2017-05-16 NOTE — Anesthesia Preprocedure Evaluation (Signed)
Anesthesia Evaluation  Patient identified by MRN, date of birth, ID band Patient awake    Reviewed: Allergy & Precautions, NPO status , Patient's Chart, lab work & pertinent test results  Airway Mallampati: II  TM Distance: >3 FB Neck ROM: Full    Dental  (+) Edentulous Upper, Edentulous Lower   Pulmonary    breath sounds clear to auscultation       Cardiovascular  Rhythm:Regular Rate:Normal     Neuro/Psych    GI/Hepatic   Endo/Other    Renal/GU      Musculoskeletal   Abdominal   Peds  Hematology   Anesthesia Other Findings   Reproductive/Obstetrics                             Anesthesia Physical Anesthesia Plan  ASA: II and emergent  Anesthesia Plan: General   Post-op Pain Management:    Induction: Intravenous, Rapid sequence and Cricoid pressure planned  PONV Risk Score and Plan: Ondansetron and Dexamethasone  Airway Management Planned: Oral ETT  Additional Equipment:   Intra-op Plan:   Post-operative Plan: Extubation in OR  Informed Consent: I have reviewed the patients History and Physical, chart, labs and discussed the procedure including the risks, benefits and alternatives for the proposed anesthesia with the patient or authorized representative who has indicated his/her understanding and acceptance.     Plan Discussed with: CRNA and Anesthesiologist  Anesthesia Plan Comments:         Anesthesia Quick Evaluation

## 2017-05-16 NOTE — Brief Op Note (Signed)
05/15/2017 - 05/16/2017  12:51 AM  PATIENT:  Jose Hartman  56 y.o. male  PRE-OPERATIVE DIAGNOSIS:  Food impaction  POST-OPERATIVE DIAGNOSIS:  * No post-op diagnosis entered *  PROCEDURE:  Procedure(s): ESOPHAGOGASTRODUODENOSCOPY (EGD) (Left)  SURGEON:  Surgeon(s) and Role:    Ronnette Juniper, MD - Primary  PHYSICIAN ASSISTANT:   ASSISTANTS: none   ANESTHESIA:   MAC  EBL:  No intake/output data recorded.  BLOOD ADMINISTERED:none  DRAINS: none   LOCAL MEDICATIONS USED:  NONE  SPECIMEN:  Biopsy / Limited Resection  DISPOSITION OF SPECIMEN:  PATHOLOGY  COUNTS:  YES  TOURNIQUET:  * No tourniquets in log *  DICTATION: .Dragon Dictation  PLAN OF CARE: Discharge to home after PACU  PATIENT DISPOSITION:  PACU - hemodynamically stable.   Delay start of Pharmacological VTE agent (>24hrs) due to surgical blood loss or risk of bleeding: no

## 2017-05-16 NOTE — Anesthesia Procedure Notes (Signed)
Procedure Name: Intubation Date/Time: 05/16/2017 12:35 AM Performed by: West Pugh Pre-anesthesia Checklist: Patient identified, Emergency Drugs available, Suction available, Patient being monitored and Timeout performed Patient Re-evaluated:Patient Re-evaluated prior to induction Oxygen Delivery Method: Circle system utilized Preoxygenation: Pre-oxygenation with 100% oxygen Induction Type: IV induction, Rapid sequence and Cricoid Pressure applied Laryngoscope Size: Mac and 3 Grade View: Grade I Tube type: Oral Tube size: 7.5 mm Number of attempts: 1 Airway Equipment and Method: Stylet Placement Confirmation: ETT inserted through vocal cords under direct vision,  positive ETCO2,  CO2 detector and breath sounds checked- equal and bilateral Secured at: 21 cm Tube secured with: Tape Dental Injury: Teeth and Oropharynx as per pre-operative assessment

## 2017-05-16 NOTE — Transfer of Care (Signed)
Immediate Anesthesia Transfer of Care Note  Patient: Jose Hartman  Procedure(s) Performed: Procedure(s): ESOPHAGOGASTRODUODENOSCOPY (EGD) (Left)  Patient Location: PACU  Anesthesia Type:General  Level of Consciousness:  sedated, patient cooperative and responds to stimulation  Airway & Oxygen Therapy:Patient Spontanous Breathing and Patient connected to face mask oxgen  Post-op Assessment:  Report given to PACU RN and Post -op Vital signs reviewed and stable  Post vital signs:  Reviewed and stable  Last Vitals:  Vitals:   05/16/17 0020 05/16/17 0100  BP:  124/77  Pulse: 88 83  Resp: 20 (!) 22  Temp: 36.9 C 36.9 C  SpO2:  709%    Complications: No apparent anesthesia complications

## 2017-05-18 ENCOUNTER — Encounter (HOSPITAL_COMMUNITY): Payer: Self-pay | Admitting: Gastroenterology

## 2017-05-18 NOTE — Anesthesia Postprocedure Evaluation (Signed)
Anesthesia Post Note  Patient: Jose Hartman  Procedure(s) Performed: ESOPHAGOGASTRODUODENOSCOPY (EGD) (Left )     Anesthesia Post Evaluation  Last Vitals:  Vitals:   05/16/17 0130 05/16/17 0138  BP: 116/77 (!) 122/91  Pulse: 79 83  Resp: 18   Temp:  36.5 C  SpO2: 98% 95%    Last Pain:  Vitals:   05/16/17 0138  TempSrc:   PainSc: 0-No pain                 Ladale Sherburn COKER

## 2018-06-18 ENCOUNTER — Other Ambulatory Visit: Payer: Self-pay

## 2018-06-18 ENCOUNTER — Encounter (HOSPITAL_COMMUNITY): Payer: Self-pay

## 2018-06-18 ENCOUNTER — Emergency Department (HOSPITAL_COMMUNITY)
Admission: EM | Admit: 2018-06-18 | Discharge: 2018-06-19 | Disposition: A | Discharge status: Home / Self Care | Payer: Medicare Other | Attending: Emergency Medicine | Admitting: Emergency Medicine

## 2018-06-18 DIAGNOSIS — K222 Esophageal obstruction: Secondary | ICD-10-CM | POA: Diagnosis not present

## 2018-06-18 DIAGNOSIS — Z91010 Allergy to peanuts: Secondary | ICD-10-CM | POA: Diagnosis not present

## 2018-06-18 DIAGNOSIS — J45909 Unspecified asthma, uncomplicated: Secondary | ICD-10-CM | POA: Diagnosis not present

## 2018-06-18 DIAGNOSIS — R0989 Other specified symptoms and signs involving the circulatory and respiratory systems: Secondary | ICD-10-CM | POA: Diagnosis present

## 2018-06-18 NOTE — ED Triage Notes (Signed)
Pt awy is patent and is able to talk and does not appear to be in any distress at this time,

## 2018-06-18 NOTE — ED Triage Notes (Signed)
Pt stats that he has food stuck in his epigastric region 8/10 hurts and states that he ate BBQ. Pt states that every time he tries to drink water it comes back up and in the past when this happens his able to regurgitate the food to expell it. Pt is able top talk in full sentences and does not have and labored breathing.

## 2018-06-19 LAB — I-STAT CHEM 8, ED
BUN: 11 mg/dL (ref 6–20)
CALCIUM ION: 1.18 mmol/L (ref 1.15–1.40)
Chloride: 106 mmol/L (ref 98–111)
Creatinine, Ser: 0.8 mg/dL (ref 0.61–1.24)
GLUCOSE: 99 mg/dL (ref 70–99)
HCT: 40 % (ref 39.0–52.0)
HEMOGLOBIN: 13.6 g/dL (ref 13.0–17.0)
Potassium: 4 mmol/L (ref 3.5–5.1)
Sodium: 142 mmol/L (ref 135–145)
TCO2: 29 mmol/L (ref 22–32)

## 2018-06-19 MED ORDER — GLUCAGON HCL RDNA (DIAGNOSTIC) 1 MG IJ SOLR
1.0000 mg | Freq: Once | INTRAMUSCULAR | Status: AC
  Administered 2018-06-19: 1 mg via INTRAVENOUS
  Filled 2018-06-19: qty 1

## 2018-06-19 NOTE — Discharge Instructions (Addendum)
Stay on a clear liquid diet until seen by GI on Monday. Call Dr. Encarnacion Slates office for an appointment Monday for further evaluation and treatment of recurrent symptoms. Return to the emergency department with any recurrent symptoms, fever, severe pain or new concern.

## 2018-06-19 NOTE — ED Provider Notes (Signed)
[facility] DEPT Provider Note   CSN: [id number] Arrival date & time: 06/18/18  2147     History   Chief Complaint Chief Complaint  Patient presents with  . Choking    HPI Jose Hartman is a 57 y.o. male.  Patient without significant medical history presents with epigastric pain that started earlier today. He was eating BBQ and cole slaw and feels the bite of BBQ he swallowed became stuck, causing his pain. Since that time he has been unable to tolerate solids or liquids without vomiting. No bleeding in what comes back up. He experienced similar symptoms last year requiring food bolus disimpaction via endoscopy. He states he did follow up in the outpatient setting and underwent esophageal dilation. No symptoms since that time.   The history is provided by the patient. No language interpreter was used.    Past Medical History:  Diagnosis Date  . Renal disorder    kidney stones    Patient Active Problem List   Diagnosis Date Noted  . Left nasal polyps 02/04/2016  . Left LBP 01/16/2016  . 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 Recurrent kidney stones 12/11/2015  . Asthma 12/11/2015  . Sinusitis, chronic 12/11/2015    Past Surgical History:  Procedure Laterality Date  . CYSTOSCOPY/RETROGRADE/URETEROSCOPY/STONE EXTRACTION WITH BASKET Left 09/09/2013   Procedure: CYSTOSCOPY/LEFT RETROGRADE PYELOGRAM /FLEXIBLE URETEROSCOPY/INSERTION LEFT URETERAL STENT;  Surgeon: Ardis Hughs, MD;  Location: [facility] ORS;  Service: Urology;  Laterality: Left;  . ESOPHAGOGASTRODUODENOSCOPY Left 05/15/2017   Procedure: ESOPHAGOGASTRODUODENOSCOPY (EGD);  Surgeon: Ronnette Juniper, MD;  Location: [facility] ORS;  Service: Gastroenterology;  Laterality: Left;  . EXTRACORPOREAL SHOCK WAVE LITHOTRIPSY    . Uretheral Stent placement          Home Medications    Prior to Admission medications   Medication Sig Start Date End Date Taking? Authorizing Provider  albuterol (PROVENTIL  HFA;VENTOLIN HFA) 108 (90 Base) MCG/ACT inhaler Inhale 2 puffs into the lungs every 6 (six) hours as needed for wheezing or shortness of breath. 12/11/15  Yes Vivi Barrack, MD  beclomethasone (QVAR) 80 MCG/ACT inhaler Inhale 1 puff into the lungs 2 (two) times daily. Patient taking differently: Inhale 1 puff into the lungs 2 (two) times daily as needed (sob or wheezing).  12/11/15  Yes Vivi Barrack, MD  Spacer/Aero-Holding Chambers (AEROCHAMBER PLUS FLO-VU MEDIUM) MISC 1 each by Other route once. 12/11/15   Vivi Barrack, MD    Family History Family History  Problem Relation Age of Onset  . Heart disease Unknown   . Diabetes Mellitus II Unknown   . Esophageal cancer Brother     Social History Social History   Tobacco Use  . Smoking status: Never Smoker  . Smokeless tobacco: Never Used  Substance Use Topics  . Alcohol use: Yes    Comment: occ.  . Drug use: No     Allergies   Benadryl [diphenhydramine hcl] and Peanuts [peanut oil]   Review of Systems Review of Systems  Constitutional: Negative for chills and fever.  HENT: Negative.   Respiratory: Negative.   Cardiovascular: Negative.   Gastrointestinal: Positive for abdominal pain and vomiting. Negative for nausea.  Musculoskeletal: Negative.   Skin: Negative.   Neurological: Negative.      Physical Exam Updated Vital Signs BP 129/81 (BP Location: Left Arm)   Pulse 67   Temp 98.4 F (36.9 C) (Oral)   Resp 18   Ht 5\' 6"  (1.676 m)   Wt  56.7 kg   SpO2 98%   BMI 20.18 kg/m   Physical Exam  Constitutional: He is oriented to person, place, and time. He appears well-developed and well-nourished.  HENT:  Head: Normocephalic.  Neck: Normal range of motion. Neck supple.  Cardiovascular: Normal rate and regular rhythm.  Pulmonary/Chest: Effort normal and breath sounds normal. No stridor. He has no wheezes. He has no rales.  Abdominal: Soft. Bowel sounds are normal. He exhibits no distension. There is tenderness in  the epigastric area. There is no rebound and no guarding.  Musculoskeletal: Normal range of motion.  Neurological: He is alert and oriented to person, place, and time.  Skin: Skin is warm and dry. No rash noted.  Psychiatric: He has a normal mood and affect.     ED Treatments / Results  Labs (all labs ordered are listed, but only abnormal results are displayed) Labs Reviewed  I-STAT CHEM 8, ED    EKG None  Radiology No results found.  Procedures Procedures (including critical care time)  Medications Ordered in ED Medications  glucagon (human recombinant) (GLUCAGEN) injection 1 mg (has no administration in time range)     Initial Impression / Assessment and Plan / ED Course  I have reviewed the triage vital signs and the nursing notes.  Pertinent labs & imaging results that were available during my care of the patient were reviewed by me and considered in my medical decision making (see chart for details).     Patient to ED with epigastric pain, vomiting any PO intake since earlier after he feels his BBQ became lodged in his esophagus. Symptoms are recurrent from food impaction last year requiring endoscopy to disimpact.   Glucagon given in ED. PO challenge with water provided and patient is tolerating without vomiting. 2nd cup of water being tested. He reports pain is no better.  Recheck after 2nd cup of water. He reports no vomiting or regurgitation. He is felt appropriate for discharge home on clear liquid diet and close GI follow up. Patient and family are comfortable with disposition.   Final Clinical Impressions(s) / ED Diagnoses   Final diagnoses:  None   1. Esophageal stricture  ED Discharge Orders    None       Dennie Bible 91/63/84 [id number]    Delora Fuel, MD 93/57/01 [phone]

## 2018-06-19 NOTE — ED Notes (Signed)
Pt says he is feeling somewhat better, still having a "pain" in the epigastric area.

## 2018-06-28 ENCOUNTER — Other Ambulatory Visit (INDEPENDENT_AMBULATORY_CARE_PROVIDER_SITE_OTHER): Payer: Self-pay | Admitting: Otolaryngology

## 2018-06-28 DIAGNOSIS — J329 Chronic sinusitis, unspecified: Secondary | ICD-10-CM

## 2018-06-30 ENCOUNTER — Ambulatory Visit
Admission: RE | Admit: 2018-06-30 | Discharge: 2018-06-30 | Disposition: A | Discharge status: Home / Self Care | Payer: Medicare Other | Source: Ambulatory Visit | Attending: Otolaryngology | Admitting: Otolaryngology

## 2018-06-30 DIAGNOSIS — J329 Chronic sinusitis, unspecified: Secondary | ICD-10-CM

## 2018-07-30 ENCOUNTER — Other Ambulatory Visit: Payer: Self-pay | Admitting: Gastroenterology

## 2018-08-06 ENCOUNTER — Encounter (HOSPITAL_COMMUNITY): Payer: Self-pay

## 2018-08-06 ENCOUNTER — Other Ambulatory Visit: Payer: Self-pay

## 2018-08-13 ENCOUNTER — Ambulatory Visit (HOSPITAL_COMMUNITY)
Admission: RE | Admit: 2018-08-13 | Discharge: 2018-08-13 | Disposition: A | Discharge status: Home / Self Care | Payer: Medicare Other | Attending: Gastroenterology | Admitting: Gastroenterology

## 2018-08-13 ENCOUNTER — Encounter (HOSPITAL_COMMUNITY): Payer: Self-pay | Admitting: Gastroenterology

## 2018-08-13 ENCOUNTER — Encounter (HOSPITAL_COMMUNITY)
Admission: RE | Disposition: A | Discharge status: Home / Self Care | Payer: Self-pay | Source: Home / Self Care | Attending: Gastroenterology

## 2018-08-13 ENCOUNTER — Ambulatory Visit (HOSPITAL_COMMUNITY): Payer: Medicare Other | Admitting: Anesthesiology

## 2018-08-13 ENCOUNTER — Other Ambulatory Visit: Payer: Self-pay

## 2018-08-13 DIAGNOSIS — K222 Esophageal obstruction: Secondary | ICD-10-CM | POA: Insufficient documentation

## 2018-08-13 DIAGNOSIS — Z79899 Other long term (current) drug therapy: Secondary | ICD-10-CM | POA: Insufficient documentation

## 2018-08-13 DIAGNOSIS — Z888 Allergy status to other drugs, medicaments and biological substances status: Secondary | ICD-10-CM | POA: Insufficient documentation

## 2018-08-13 DIAGNOSIS — Z1211 Encounter for screening for malignant neoplasm of colon: Secondary | ICD-10-CM | POA: Diagnosis present

## 2018-08-13 DIAGNOSIS — K219 Gastro-esophageal reflux disease without esophagitis: Secondary | ICD-10-CM | POA: Diagnosis not present

## 2018-08-13 DIAGNOSIS — Z87442 Personal history of urinary calculi: Secondary | ICD-10-CM | POA: Diagnosis not present

## 2018-08-13 DIAGNOSIS — D120 Benign neoplasm of cecum: Secondary | ICD-10-CM | POA: Insufficient documentation

## 2018-08-13 DIAGNOSIS — M1990 Unspecified osteoarthritis, unspecified site: Secondary | ICD-10-CM | POA: Diagnosis not present

## 2018-08-13 DIAGNOSIS — K648 Other hemorrhoids: Secondary | ICD-10-CM | POA: Insufficient documentation

## 2018-08-13 HISTORY — PX: ESOPHAGOGASTRODUODENOSCOPY: SHX5428

## 2018-08-13 HISTORY — PX: COLONOSCOPY: SHX5424

## 2018-08-13 HISTORY — DX: Personal history of urinary calculi: Z87.442

## 2018-08-13 HISTORY — DX: Gastro-esophageal reflux disease without esophagitis: K21.9

## 2018-08-13 HISTORY — PX: POLYPECTOMY: SHX5525

## 2018-08-13 HISTORY — PX: BALLOON DILATION: SHX5330

## 2018-08-13 HISTORY — DX: Unspecified osteoarthritis, unspecified site: M19.90

## 2018-08-13 SURGERY — EGD (ESOPHAGOGASTRODUODENOSCOPY)
Anesthesia: Monitor Anesthesia Care

## 2018-08-13 MED ORDER — SODIUM CHLORIDE 0.9 % IV SOLN: INTRAVENOUS | Status: DC

## 2018-08-13 MED ORDER — PROPOFOL 10 MG/ML IV BOLUS
INTRAVENOUS | Status: AC
  Filled 2018-08-13: qty 20

## 2018-08-13 MED ORDER — LIDOCAINE 2% (20 MG/ML) 5 ML SYRINGE
INTRAMUSCULAR | Status: DC | PRN
  Administered 2018-08-13: 60 mg via INTRAVENOUS

## 2018-08-13 MED ORDER — LACTATED RINGERS IV SOLN
INTRAVENOUS | Status: DC
  Administered 2018-08-13: 1000 mL via INTRAVENOUS

## 2018-08-13 MED ORDER — PROPOFOL 500 MG/50ML IV EMUL
INTRAVENOUS | Status: DC | PRN
  Administered 2018-08-13: 125 ug/kg/min via INTRAVENOUS

## 2018-08-13 MED ORDER — ONDANSETRON HCL 4 MG/2ML IJ SOLN
INTRAMUSCULAR | Status: DC | PRN
  Administered 2018-08-13: 4 mg via INTRAVENOUS

## 2018-08-13 MED ORDER — PROPOFOL 10 MG/ML IV BOLUS
INTRAVENOUS | Status: DC | PRN
  Administered 2018-08-13 (×2): 10 mg via INTRAVENOUS
  Administered 2018-08-13: 20 mg via INTRAVENOUS

## 2018-08-13 NOTE — Discharge Instructions (Signed)
Colonoscopy, Adult, Care After This sheet gives you information about how to care for yourself after your procedure. Your doctor may also give you more specific instructions. If you have problems or questions, call your doctor. What can I expect after the procedure? After the procedure, it is common to have: A small amount of blood in your poop for 24 hours. Some gas. Mild cramping or bloating in your belly. Follow these instructions at home: General instructions For the first 24 hours after the procedure: Do not drive or use machinery. Do not sign important documents. Do not drink alcohol. Do your daily activities more slowly than normal. Eat foods that are soft and easy to digest. Take over-the-counter or prescription medicines only as told by your doctor. To help cramping and bloating:  Try walking around. Put heat on your belly (abdomen) as told by your doctor. Use a heat source that your doctor recommends, such as a moist heat pack or a heating pad. Put a towel between your skin and the heat source. Leave the heat on for 20-30 minutes. Remove the heat if your skin turns bright red. This is especially important if you cannot feel pain, heat, or cold. You can get burned. Eating and drinking  Drink enough fluid to keep your pee (urine) clear or pale yellow. Return to your normal diet as told by your doctor. Avoid heavy or fried foods that are hard to digest. Avoid drinking alcohol for as long as told by your doctor. Contact a doctor if: You have blood in your poop (stool) 2-3 days after the procedure. Get help right away if: You have more than a small amount of blood in your poop. You see large clumps of tissue (blood clots) in your poop. Your belly is swollen. You feel sick to your stomach (nauseous). You throw up (vomit). You have a fever. You have belly pain that gets worse, and medicine does not help your pain. Summary After the procedure, it is common to have a small  amount of blood in your poop. You may also have mild cramping and bloating in your belly. For the first 24 hours after the procedure, do not drive or use machinery, do not sign important documents, and do not drink alcohol. Get help right away if you have a lot of blood in your poop, feel sick to your stomach, have a fever, or have more belly pain. This information is not intended to replace advice given to you by your health care provider. Make sure you discuss any questions you have with your health care provider. Document Released: 08/30/2010 Document Revised: 05/28/2017 Document Reviewed: 04/21/2016 Elsevier Interactive Patient Education  2019 [place] [facility], Adult, Care After This sheet gives you information about how to care for yourself after your procedure. Your health care provider may also give you more specific instructions. If you have problems or questions, contact your health care provider. What can I expect after the procedure? After the procedure, it is common to have:  A sore throat.  Mild stomach pain or discomfort.  Bloating.  Nausea. Follow these instructions at home:   Follow instructions from your health care provider about what to eat or drink after your procedure.  Return to your normal activities as told by your health care provider. Ask your health care provider what activities are safe for you.  Take over-the-counter and prescription medicines only as told by your health care provider.  Do not drive for 24 hours if you were  given a sedative during your procedure.  Keep all follow-up visits as told by your health care provider. This is important. Contact a health care provider if you have:  A sore throat that lasts longer than one day.  Trouble swallowing. Get help right away if:  You vomit blood or your vomit looks like coffee grounds.  You have: ? A fever. ? Bloody, black, or tarry stools. ? A severe sore throat or you cannot  swallow. ? Difficulty breathing. ? Severe pain in your chest or abdomen. Summary  After the procedure, it is common to have a sore throat, mild stomach discomfort, bloating, and nausea.  Do not drive for 24 hours if you were given a sedative during the procedure.  Follow instructions from your health care provider about what to eat or drink after your procedure.  Return to your normal activities as told by your health care provider. This information is not intended to replace advice given to you by your health care provider. Make sure you discuss any questions you have with your health care provider. Document Released: 01/27/2012 Document Revised: 12/28/2017 Document Reviewed: 12/28/2017 Elsevier Interactive Patient Education  2019 [employer]. Colonoscopy, Adult, Care After This sheet gives you information about how to care for yourself after your procedure. Your doctor may also give you more specific instructions. If you have problems or questions, call your doctor. What can I expect after the procedure? After the procedure, it is common to have:  A small amount of blood in your poop for 24 hours.  Some gas.  Mild cramping or bloating in your belly. Follow these instructions at home: General instructions  For the first 24 hours after the procedure: ? Do not drive or use machinery. ? Do not sign important documents. ? Do not drink alcohol. ? Do your daily activities more slowly than normal. ? Eat foods that are soft and easy to digest.  Take over-the-counter or prescription medicines only as told by your doctor. To help cramping and bloating:   Try walking around.  Put heat on your belly (abdomen) as told by your doctor. Use a heat source that your doctor recommends, such as a moist heat pack or a heating pad. ? Put a towel between your skin and the heat source. ? Leave the heat on for 20-30 minutes. ? Remove the heat if your skin turns bright red. This is especially  important if you cannot feel pain, heat, or cold. You can get burned. Eating and drinking   Drink enough fluid to keep your pee (urine) clear or pale yellow.  Return to your normal diet as told by your doctor. Avoid heavy or fried foods that are hard to digest.  Avoid drinking alcohol for as long as told by your doctor. Contact a doctor if:  You have blood in your poop (stool) 2-3 days after the procedure. Get help right away if:  You have more than a small amount of blood in your poop.  You see large clumps of tissue (blood clots) in your poop.  Your belly is swollen.  You feel sick to your stomach (nauseous).  You throw up (vomit).  You have a fever.  You have belly pain that gets worse, and medicine does not help your pain. Summary  After the procedure, it is common to have a small amount of blood in your poop. You may also have mild cramping and bloating in your belly.  For the first 24 hours after the procedure,  do not drive or use machinery, do not sign important documents, and do not drink alcohol.  Get help right away if you have a lot of blood in your poop, feel sick to your stomach, have a fever, or have more belly pain. This information is not intended to replace advice given to you by your health care provider. Make sure you discuss any questions you have with your health care provider. Document Released: 08/30/2010 Document Revised: 05/28/2017 Document Reviewed: 04/21/2016 Elsevier Interactive Patient Education  2019 [employer].

## 2018-08-13 NOTE — Anesthesia Preprocedure Evaluation (Addendum)
Anesthesia Evaluation  Patient identified by MRN, date of birth, ID band Patient awake    Reviewed: Allergy & Precautions, NPO status , Patient's Chart, lab work & pertinent test results  History of Anesthesia Complications Negative for: history of anesthetic complications  Airway Mallampati: II  TM Distance: >3 FB Neck ROM: Full    Dental  (+) Dental Advisory Given, Edentulous Upper, Edentulous Lower   Pulmonary neg pulmonary ROS, asthma ,    Pulmonary exam normal        Cardiovascular negative cardio ROS Normal cardiovascular exam     Neuro/Psych negative neurological ROS  negative psych ROS   GI/Hepatic Neg liver ROS, GERD  ,  Endo/Other  negative endocrine ROS  Renal/GU negative Renal ROS  negative genitourinary   Musculoskeletal negative musculoskeletal ROS (+) Arthritis ,   Abdominal   Peds negative pediatric ROS (+)  Hematology negative hematology ROS (+)   Anesthesia Other Findings   Reproductive/Obstetrics negative OB ROS                                                             Anesthesia Evaluation  Patient identified by MRN, date of birth, ID band Patient awake    Reviewed: Allergy & Precautions, NPO status , Patient's Chart, lab work & pertinent test results  Airway Mallampati: II  TM Distance: >3 FB Neck ROM: Full    Dental  (+) Edentulous Upper, Edentulous Lower   Pulmonary    breath sounds clear to auscultation       Cardiovascular  Rhythm:Regular Rate:Normal     Neuro/Psych    GI/Hepatic   Endo/Other    Renal/GU      Musculoskeletal   Abdominal   Peds  Hematology   Anesthesia Other Findings   Reproductive/Obstetrics                             Anesthesia Physical Anesthesia Plan  ASA: II and emergent  Anesthesia Plan: General   Post-op Pain Management:    Induction: Intravenous, Rapid  sequence and Cricoid pressure planned  PONV Risk Score and Plan: Ondansetron and Dexamethasone  Airway Management Planned: Oral ETT  Additional Equipment:   Intra-op Plan:   Post-operative Plan: Extubation in OR  Informed Consent: I have reviewed the patients History and Physical, chart, labs and discussed the procedure including the risks, benefits and alternatives for the proposed anesthesia with the patient or authorized representative who has indicated his/her understanding and acceptance.     Plan Discussed with: CRNA and Anesthesiologist  Anesthesia Plan Comments:         Anesthesia Quick Evaluation                                   Anesthesia Evaluation  Patient identified by MRN, date of birth, ID band Patient awake    Reviewed: Allergy & Precautions, NPO status , Patient's Chart, lab work & pertinent test results  Airway Mallampati: II  TM Distance: >3 FB Neck ROM: Full    Dental  (+) Edentulous Upper, Edentulous Lower   Pulmonary    breath sounds clear to auscultation       Cardiovascular  Rhythm:Regular  Rate:Normal     Neuro/Psych    GI/Hepatic   Endo/Other    Renal/GU      Musculoskeletal   Abdominal   Peds  Hematology   Anesthesia Other Findings   Reproductive/Obstetrics                             Anesthesia Physical Anesthesia Plan  ASA: II and emergent  Anesthesia Plan: General   Post-op Pain Management:    Induction: Intravenous, Rapid sequence and Cricoid pressure planned  PONV Risk Score and Plan: Ondansetron and Dexamethasone  Airway Management Planned: Oral ETT  Additional Equipment:   Intra-op Plan:   Post-operative Plan: Extubation in OR  Informed Consent: I have reviewed the patients History and Physical, chart, labs and discussed the procedure including the risks, benefits and alternatives for the proposed anesthesia with the patient or authorized representative who  has indicated his/her understanding and acceptance.     Plan Discussed with: CRNA and Anesthesiologist  Anesthesia Plan Comments:         Anesthesia Quick Evaluation  Anesthesia Physical Anesthesia Plan  ASA: II  Anesthesia Plan: MAC   Post-op Pain Management:    Induction: Intravenous  PONV Risk Score and Plan: 2 and Ondansetron and Propofol infusion  Airway Management Planned: Natural Airway and Simple Face Mask  Additional Equipment:   Intra-op Plan:   Post-operative Plan:   Informed Consent: I have reviewed the patients History and Physical, chart, labs and discussed the procedure including the risks, benefits and alternatives for the proposed anesthesia with the patient or authorized representative who has indicated his/her understanding and acceptance.   Dental advisory given  Plan Discussed with: CRNA and Anesthesiologist  Anesthesia Plan Comments:         Anesthesia Quick Evaluation

## 2018-08-13 NOTE — Anesthesia Procedure Notes (Signed)
Procedure Name: MAC Date/Time: 08/13/2018 8:51 AM Performed by: Maxwell Caul, CRNA Pre-anesthesia Checklist: Patient identified, Emergency Drugs available, Suction available and Patient being monitored Oxygen Delivery Method: Simple face mask and Nasal cannula

## 2018-08-13 NOTE — Transfer of Care (Signed)
Immediate Anesthesia Transfer of Care Note  Patient: Jose Hartman  Procedure(s) Performed: ESOPHAGOGASTRODUODENOSCOPY (EGD) (N/A ) BALLOON DILATION (N/A ) COLONOSCOPY (N/A ) POLYPECTOMY  Patient Location: PACU  Anesthesia Type:MAC  Level of Consciousness: awake, alert  and oriented  Airway & Oxygen Therapy: Patient Spontanous Breathing, Patient connected to nasal cannula oxygen and Patient connected to face mask oxygen  Post-op Assessment: Report given to RN and Post -op Vital signs reviewed and stable  Post vital signs: Reviewed and stable  Last Vitals:  Vitals Value Taken Time  BP    Temp    Pulse 64 08/13/2018  9:33 AM  Resp 15 08/13/2018  9:33 AM  SpO2 100 % 08/13/2018  9:33 AM  Vitals shown include unvalidated device data.  Last Pain:  Vitals:   08/13/18 0837  TempSrc: Oral  PainSc: 0-No pain         Complications: No apparent anesthesia complications

## 2018-08-13 NOTE — Op Note (Signed)
[facility] Patient Name: Jose Hartman Procedure Date: 08/13/2018 MRN: [id number] Attending MD: Ronnette Juniper , MD Date of Birth: 07-Oct-1960 CSN: [id number] Age: 58 Admit Type: Outpatient Procedure:                Colonoscopy Indications:              Screening for colorectal malignant neoplasm, This                            is the patient's first colonoscopy Providers:                Ronnette Juniper, MD, Burtis Junes, RN, Cherylynn Ridges,                            Technician, Virgia Land, CRNA Referring MD:              Medicines:                Monitored Anesthesia Care Complications:            No immediate complications. Estimated blood loss:                            Minimal. Estimated Blood Loss:     Estimated blood loss was minimal. Procedure:                Pre-Anesthesia Assessment:                           - Prior to the procedure, a History and Physical                            was performed, and patient medications and                            allergies were reviewed. The patient's tolerance of                            previous anesthesia was also reviewed. The risks                            and benefits of the procedure and the sedation                            options and risks were discussed with the patient.                            All questions were answered, and informed consent                            was obtained. Prior Anticoagulants: The patient has                            taken no previous anticoagulant or antiplatelet                            agents. ASA  Grade Assessment: II - A patient with                            mild systemic disease. After reviewing the risks                            and benefits, the patient was deemed in                            satisfactory condition to undergo the procedure.                           After obtaining informed consent, the colonoscope                            was passed under  direct vision. Throughout the                            procedure, the patient's blood pressure, pulse, and                            oxygen saturations were monitored continuously. The                            [id number]-[id number] ([id number] Olympus peds colonscope was                            introduced through the anus and advanced to the the                            cecum, identified by appendiceal orifice and                            ileocecal valve. The colonoscopy was performed                            without difficulty. The patient tolerated the                            procedure well. The quality of the bowel                            preparation was good. Scope In: 9:08:38 AM Scope Out: 9:27:10 AM Scope Withdrawal Time: 0 hours 13 minutes 3 seconds  Total Procedure Duration: 0 hours 18 minutes 32 seconds  Findings:      The perianal and digital rectal examinations were normal.      Non-bleeding internal hemorrhoids were found during retroflexion. The       hemorrhoids were small.      Two sessile polyps were found in the cecum. The polyps were 3 to 5 mm in       size. These polyps were removed with a piecemeal technique using a cold       biopsy forceps. Resection and retrieval were complete.      The exam was otherwise without  abnormality. Impression:               - Non-bleeding internal hemorrhoids.                           - Two 3 to 5 mm polyps in the cecum, removed                            piecemeal using a cold biopsy forceps. Resected and                            retrieved.                           - The examination was otherwise normal. Moderate Sedation:      Patient did not receive moderate sedation for this procedure, but       instead received monitored anesthesia care. Recommendation:           - Patient has a contact number available for                            emergencies. The signs and symptoms of potential                            delayed  complications were discussed with the                            patient. Return to normal activities tomorrow.                            Written discharge instructions were provided to the                            patient.                           - Resume regular diet.                           - Continue present medications.                           - Await pathology results.                           - Repeat colonoscopy for surveillance based on                            pathology results. Procedure Code(s):        --- Professional ---                           [phone], Colonoscopy, flexible; with biopsy, single                            or multiple Diagnosis Code(s):        --- Professional ---  Z12.11, Encounter for screening for malignant                            neoplasm of colon                           D12.0, Benign neoplasm of cecum                           K64.8, Other hemorrhoids CPT copyright 2018 [employer]. All rights reserved. The codes documented in this report are preliminary and upon coder review may  be revised to meet current compliance requirements. Ronnette Juniper, MD 08/13/2018 9:35:02 AM This report has been signed electronically. Number of Addenda: 0

## 2018-08-13 NOTE — Brief Op Note (Signed)
08/13/2018  9:35 AM  PATIENT:  Jose Hartman  58 y.o. male  PRE-OPERATIVE DIAGNOSIS:  Esophageal stricture, Esophageal dyphagia, screen for colon cancer  POST-OPERATIVE DIAGNOSIS:  Esophageal stricture, dysphagia, esophageal balloon dilation, cecal polyp x2  PROCEDURE:  Procedure(s): ESOPHAGOGASTRODUODENOSCOPY (EGD) (N/A) BALLOON DILATION (N/A) COLONOSCOPY (N/A) POLYPECTOMY  SURGEON:  Surgeon(s) and Role:    Ronnette Juniper, MD - Primary  PHYSICIAN ASSISTANT:   ASSISTANTS:Lisa Marissa Calamity, RN, Janie Billups, Tech   ANESTHESIA:   MAC  EBL:  Minimal  BLOOD ADMINISTERED:none  DRAINS: none   LOCAL MEDICATIONS USED:  None SPECIMEN:  Biopsy / Limited Resection  DISPOSITION OF SPECIMEN:  PATHOLOGY  COUNTS:  YES  TOURNIQUET:  * No tourniquets in log *  DICTATION: .Dragon Dictation  PLAN OF CARE: Discharge to home after PACU  PATIENT DISPOSITION:  PACU - hemodynamically stable.   Delay start of Pharmacological VTE agent (>24hrs) due to surgical blood loss or risk of bleeding: no

## 2018-08-13 NOTE — Anesthesia Postprocedure Evaluation (Signed)
Anesthesia Post Note  Patient: Jose Hartman  Procedure(s) Performed: ESOPHAGOGASTRODUODENOSCOPY (EGD) (N/A ) BALLOON DILATION (N/A ) COLONOSCOPY (N/A ) POLYPECTOMY     Patient location during evaluation: Endoscopy Anesthesia Type: MAC Level of consciousness: awake and alert Pain management: pain level controlled Vital Signs Assessment: post-procedure vital signs reviewed and stable Respiratory status: spontaneous breathing, nonlabored ventilation, respiratory function stable and patient connected to nasal cannula oxygen Cardiovascular status: blood pressure returned to baseline and stable Postop Assessment: no apparent nausea or vomiting Anesthetic complications: no    Last Vitals:  Vitals:   08/13/18 0940 08/13/18 0950  BP: 115/78 124/75  Pulse: 66 (!) 56  Resp: (!) 23 11  Temp:    SpO2: 100% 100%    Last Pain:  Vitals:   08/13/18 0950  TempSrc:   PainSc: 0-No pain                 Preeti Winegardner DANIEL

## 2018-08-13 NOTE — Op Note (Signed)
[facility] Patient Name: Jose Hartman Procedure Date: 08/13/2018 MRN: [id number] Attending MD: Ronnette Juniper , MD Date of Birth: February 09, 1961 CSN: [id number] Age: 58 Admit Type: Outpatient Procedure:                Upper GI endoscopy Indications:              Dysphagia, For therapy of esophageal stenosis Providers:                Ronnette Juniper, MD, Burtis Junes, RN, Cherylynn Ridges,                            Technician, Virgia Land, CRNA Referring MD:              Medicines:                Monitored Anesthesia Care Complications:            No immediate complications. Estimated blood loss:                            Minimal. Estimated Blood Loss:     Estimated blood loss was minimal. Procedure:                Pre-Anesthesia Assessment:                           - Prior to the procedure, a History and Physical                            was performed, and patient medications and                            allergies were reviewed. The patient's tolerance of                            previous anesthesia was also reviewed. The risks                            and benefits of the procedure and the sedation                            options and risks were discussed with the patient.                            All questions were answered, and informed consent                            was obtained. Prior Anticoagulants: The patient has                            taken no previous anticoagulant or antiplatelet                            agents. ASA Grade Assessment: II - A patient with  mild systemic disease. After reviewing the risks                            and benefits, the patient was deemed in                            satisfactory condition to undergo the procedure.                           - Prior to the procedure, a History and Physical                            was performed, and patient medications and                            allergies  were reviewed. The patient's tolerance of                            previous anesthesia was also reviewed. The risks                            and benefits of the procedure and the sedation                            options and risks were discussed with the patient.                            All questions were answered, and informed consent                            was obtained. Prior Anticoagulants: The patient has                            taken no previous anticoagulant or antiplatelet                            agents. ASA Grade Assessment: II - A patient with                            mild systemic disease. After reviewing the risks                            and benefits, the patient was deemed in                            satisfactory condition to undergo the procedure.                           After obtaining informed consent, the endoscope was                            passed under direct vision. Throughout the  procedure, the patient's blood pressure, pulse, and                            oxygen saturations were monitored continuously. The                            GIF-H190 ([id number]) Olympus adult endoscope was                            introduced through the mouth, and advanced to the                            second part of duodenum. The upper GI endoscopy was                            accomplished without difficulty. The patient                            tolerated the procedure well. Scope In: Scope Out: Findings:      One benign-appearing, intrinsic severe (stenosis; an endoscope cannot       pass) stenosis was found 36 to 38 cm from the incisors. The stenosis was       traversed after dilation. A TTS dilator was passed through the scope.       Dilation with a 15-16.5-18 mm x 8 cm CRE balloon dilator was performed       initially with 15 mm and then with 16.5 mm for 1 minute each. The       dilation site was examined following  endoscope reinsertion and showed       moderate mucosal disruption and complete resolution of luminal       narrowing. Estimated blood loss was minimal.      The entire examined stomach was normal.      The cardia and gastric fundus were normal on retroflexion.      The examined duodenum was normal. Impression:               - Benign-appearing esophageal stenosis. Dilated.                           - Normal stomach.                           - Normal examined duodenum.                           - No specimens collected. Moderate Sedation:      Patient did not receive moderate sedation for this procedure, but       instead received monitored anesthesia care. Recommendation:           - Patient has a contact number available for                            emergencies. The signs and symptoms of potential                            delayed complications were discussed with  the                            patient. Return to normal activities tomorrow.                            Written discharge instructions were provided to the                            patient.                           - Mechanical soft diet.                           - Continue present medications. Procedure Code(s):        --- Professional ---                           [phone], Esophagogastroduodenoscopy, flexible,                            transoral; with transendoscopic balloon dilation of                            esophagus (less than 30 mm diameter) Diagnosis Code(s):        --- Professional ---                           K22.2, Esophageal obstruction                           R13.10, Dysphagia, unspecified CPT copyright 2018 [employer]. All rights reserved. The codes documented in this report are preliminary and upon coder review may  be revised to meet current compliance requirements. Ronnette Juniper, MD 08/13/2018 9:32:52 AM This report has been signed electronically. Number of Addenda: 0

## 2018-08-13 NOTE — H&P (Signed)
Jose Hartman is an 58 y.o. male.   Chief Complaint: Esophageal stricture and screen for colon cancer HPI: 58 year old male with history of food bolus impaction requiring EGD in 10/18, and EGD performed within a few weeks with balloon dilatation at 13.5/15 mm presents with recurrent dysphagia, worse with solids than liquids. No prior colonoscopy. No family history of colon cancer.  Past Medical History:  Diagnosis Date  . Arthritis    left arm   . GERD (gastroesophageal reflux disease)   . History of kidney stones     Past Surgical History:  Procedure Laterality Date  . CYSTOSCOPY/RETROGRADE/URETEROSCOPY/STONE EXTRACTION WITH BASKET Left 09/09/2013   Procedure: CYSTOSCOPY/LEFT RETROGRADE PYELOGRAM /FLEXIBLE URETEROSCOPY/INSERTION LEFT URETERAL STENT;  Surgeon: Ardis Hughs, MD;  Location: [facility];  Service: Urology;  Laterality: Left;  . ESOPHAGOGASTRODUODENOSCOPY Left 05/15/2017   Procedure: ESOPHAGOGASTRODUODENOSCOPY (EGD);  Surgeon: Ronnette Juniper, MD;  Location: [facility];  Service: Gastroenterology;  Laterality: Left;  . EXTRACORPOREAL SHOCK WAVE LITHOTRIPSY    . FRACTURE SURGERY     left wrist  . Uretheral Stent placement      Family History  Problem Relation Age of Onset  . Heart disease Other   . Diabetes Mellitus II Other   . Esophageal cancer Brother    Social History:  reports that he has never smoked. His smokeless tobacco use includes chew. He reports current alcohol use. He reports that he does not use drugs.  Allergies:  Allergies  Allergen Reactions  . Benadryl [Diphenhydramine Hcl] Other (See Comments)    Causes him to be hyper  . Peanuts [Peanut Oil] Hives    Makes ears red and burn     Medications Prior to Admission  Medication Sig Dispense Refill  . pantoprazole (PROTONIX) 40 MG tablet Take 40 mg by mouth daily.      No results found for this or any previous visit (from the past 48 hour(s)). No results found.  Review of Systems   Constitutional: Negative.   HENT: Negative.   Eyes: Negative.   Respiratory: Negative.   Cardiovascular: Negative.   Gastrointestinal: Negative.   Musculoskeletal: Negative.   Skin: Negative.   Neurological: Negative.   Endo/Heme/Allergies: Negative.     There were no vitals taken for this visit. Physical Exam  Constitutional: He appears well-developed and well-nourished.  HENT:  Head: Normocephalic and atraumatic.  Eyes: Conjunctivae are normal.  Neck: Normal range of motion. Neck supple.  Cardiovascular: Normal rate and regular rhythm.  Respiratory: Effort normal and breath sounds normal.  GI: Soft. Bowel sounds are normal.  Musculoskeletal: Normal range of motion.  Neurological: He is alert.  Skin: Skin is warm and dry.     Assessment/Plan Esophageal stricture requiring dilatation in 10/18 Screen for colon cancer  EGD with balloon dilatation Colonoscopy  Risk and benefits of the procedure were discussed with the patient in details.  Understands and verbalizes consent.    Ronnette Juniper, MD 08/13/2018, 8:36 AM

## 2018-08-16 ENCOUNTER — Encounter (HOSPITAL_COMMUNITY): Payer: Self-pay | Admitting: Gastroenterology

## 2018-08-30 ENCOUNTER — Other Ambulatory Visit: Payer: Self-pay | Admitting: Otolaryngology

## 2018-09-07 ENCOUNTER — Encounter (HOSPITAL_BASED_OUTPATIENT_CLINIC_OR_DEPARTMENT_OTHER): Payer: Self-pay | Admitting: *Deleted

## 2018-09-07 ENCOUNTER — Other Ambulatory Visit: Payer: Self-pay

## 2018-09-07 NOTE — Progress Notes (Signed)
Pt's recent anesthesia notes reviewed with Dr Christella Hartigan. Ok for [facility] with Dr Benjamine Mola on 09/14/18.

## 2018-09-13 NOTE — Anesthesia Preprocedure Evaluation (Addendum)
Anesthesia Evaluation  Patient identified by MRN, date of birth, ID band Patient awake    Reviewed: Allergy & Precautions, NPO status , Patient's Chart, lab work & pertinent test results  History of Anesthesia Complications Negative for: history of anesthetic complications  Airway Mallampati: I  TM Distance: >3 FB Neck ROM: Full    Dental  (+) Edentulous Upper, Edentulous Lower   Pulmonary asthma ,    Pulmonary exam normal breath sounds clear to auscultation       Cardiovascular negative cardio ROS Normal cardiovascular exam Rhythm:Regular Rate:Normal     Neuro/Psych negative neurological ROS     GI/Hepatic Neg liver ROS, GERD  ,  Endo/Other  negative endocrine ROS  Renal/GU negative Renal ROS     Musculoskeletal  (+) Arthritis ,   Abdominal   Peds  Hematology negative hematology ROS (+)   Anesthesia Other Findings Day of surgery medications reviewed with the patient.  Reproductive/Obstetrics                            Anesthesia Physical Anesthesia Plan  ASA: II  Anesthesia Plan: General   Post-op Pain Management:    Induction: Intravenous  PONV Risk Score and Plan: 2 and Treatment may vary due to age or medical condition, Ondansetron and Dexamethasone  Airway Management Planned: Oral ETT  Additional Equipment:   Intra-op Plan:   Post-operative Plan: Extubation in OR  Informed Consent: I have reviewed the patients History and Physical, chart, labs and discussed the procedure including the risks, benefits and alternatives for the proposed anesthesia with the patient or authorized representative who has indicated his/her understanding and acceptance.     Dental advisory given  Plan Discussed with: CRNA  Anesthesia Plan Comments:        Anesthesia Quick Evaluation

## 2018-09-14 ENCOUNTER — Ambulatory Visit (HOSPITAL_BASED_OUTPATIENT_CLINIC_OR_DEPARTMENT_OTHER): Payer: Medicare Other | Admitting: Anesthesiology

## 2018-09-14 ENCOUNTER — Ambulatory Visit (HOSPITAL_BASED_OUTPATIENT_CLINIC_OR_DEPARTMENT_OTHER)
Admission: RE | Admit: 2018-09-14 | Discharge: 2018-09-14 | Disposition: A | Discharge status: Home / Self Care | Payer: Medicare Other | Attending: Otolaryngology | Admitting: Otolaryngology

## 2018-09-14 ENCOUNTER — Encounter (HOSPITAL_BASED_OUTPATIENT_CLINIC_OR_DEPARTMENT_OTHER)
Admission: RE | Disposition: A | Discharge status: Home / Self Care | Payer: Self-pay | Source: Home / Self Care | Attending: Otolaryngology

## 2018-09-14 ENCOUNTER — Encounter (HOSPITAL_BASED_OUTPATIENT_CLINIC_OR_DEPARTMENT_OTHER): Payer: Self-pay | Admitting: *Deleted

## 2018-09-14 ENCOUNTER — Other Ambulatory Visit: Payer: Self-pay

## 2018-09-14 DIAGNOSIS — K219 Gastro-esophageal reflux disease without esophagitis: Secondary | ICD-10-CM | POA: Insufficient documentation

## 2018-09-14 DIAGNOSIS — J45909 Unspecified asthma, uncomplicated: Secondary | ICD-10-CM | POA: Insufficient documentation

## 2018-09-14 DIAGNOSIS — J324 Chronic pansinusitis: Secondary | ICD-10-CM | POA: Insufficient documentation

## 2018-09-14 DIAGNOSIS — J321 Chronic frontal sinusitis: Secondary | ICD-10-CM

## 2018-09-14 DIAGNOSIS — J342 Deviated nasal septum: Secondary | ICD-10-CM | POA: Insufficient documentation

## 2018-09-14 DIAGNOSIS — J323 Chronic sphenoidal sinusitis: Secondary | ICD-10-CM | POA: Diagnosis not present

## 2018-09-14 DIAGNOSIS — J322 Chronic ethmoidal sinusitis: Secondary | ICD-10-CM | POA: Diagnosis not present

## 2018-09-14 DIAGNOSIS — J339 Nasal polyp, unspecified: Secondary | ICD-10-CM | POA: Insufficient documentation

## 2018-09-14 DIAGNOSIS — J3489 Other specified disorders of nose and nasal sinuses: Secondary | ICD-10-CM | POA: Diagnosis not present

## 2018-09-14 DIAGNOSIS — J320 Chronic maxillary sinusitis: Secondary | ICD-10-CM | POA: Diagnosis not present

## 2018-09-14 DIAGNOSIS — Z79899 Other long term (current) drug therapy: Secondary | ICD-10-CM | POA: Insufficient documentation

## 2018-09-14 DIAGNOSIS — J338 Other polyp of sinus: Secondary | ICD-10-CM

## 2018-09-14 HISTORY — DX: Other specified postprocedural states: Z98.890

## 2018-09-14 HISTORY — PX: SINUS ENDO WITH FUSION: SHX5329

## 2018-09-14 HISTORY — PX: SEPTOPLASTY WITH ETHMOIDECTOMY, AND MAXILLARY ANTROSTOMY: SHX6090

## 2018-09-14 SURGERY — SURGERY, PARANASAL SINUS, ENDOSCOPIC, WITH NASAL SEPTOPLASTY, TURBINOPLASTY, AND MAXILLARY SINUSOTOMY
Anesthesia: General | Site: Nose | Laterality: Bilateral

## 2018-09-14 MED ORDER — MUPIROCIN 2 % EX OINT
TOPICAL_OINTMENT | CUTANEOUS | Status: AC
  Filled 2018-09-14: qty 22

## 2018-09-14 MED ORDER — LIDOCAINE HCL (CARDIAC) PF 100 MG/5ML IV SOSY
PREFILLED_SYRINGE | INTRAVENOUS | Status: DC | PRN
  Administered 2018-09-14: 40 mg via INTRATRACHEAL

## 2018-09-14 MED ORDER — DEXAMETHASONE SODIUM PHOSPHATE 10 MG/ML IJ SOLN
INTRAMUSCULAR | Status: AC
  Filled 2018-09-14: qty 1

## 2018-09-14 MED ORDER — ROCURONIUM BROMIDE 50 MG/5ML IV SOSY
PREFILLED_SYRINGE | INTRAVENOUS | Status: AC
  Filled 2018-09-14: qty 5

## 2018-09-14 MED ORDER — FENTANYL CITRATE (PF) 100 MCG/2ML IJ SOLN
50.0000 ug | INTRAMUSCULAR | Status: AC | PRN
  Administered 2018-09-14 (×3): 50 ug via INTRAVENOUS

## 2018-09-14 MED ORDER — OXYCODONE HCL 5 MG/5ML PO SOLN: 5.0000 mg | Freq: Once | ORAL | Status: DC | PRN

## 2018-09-14 MED ORDER — FENTANYL CITRATE (PF) 100 MCG/2ML IJ SOLN
INTRAMUSCULAR | Status: AC
  Filled 2018-09-14: qty 2

## 2018-09-14 MED ORDER — LIDOCAINE-EPINEPHRINE 1 %-1:100000 IJ SOLN
INTRAMUSCULAR | Status: AC
  Filled 2018-09-14: qty 1

## 2018-09-14 MED ORDER — LIDOCAINE 2% (20 MG/ML) 5 ML SYRINGE
INTRAMUSCULAR | Status: AC
  Filled 2018-09-14: qty 5

## 2018-09-14 MED ORDER — EPINEPHRINE 30 MG/30ML IJ SOLN
INTRAMUSCULAR | Status: AC
  Filled 2018-09-14: qty 1

## 2018-09-14 MED ORDER — MIDAZOLAM HCL 2 MG/2ML IJ SOLN
INTRAMUSCULAR | Status: AC
  Filled 2018-09-14: qty 2

## 2018-09-14 MED ORDER — MIDAZOLAM HCL 2 MG/2ML IJ SOLN
1.0000 mg | INTRAMUSCULAR | Status: DC | PRN
  Administered 2018-09-14: 2 mg via INTRAVENOUS

## 2018-09-14 MED ORDER — OXYCODONE-ACETAMINOPHEN 5-325 MG PO TABS: 1.0000 | ORAL_TABLET | ORAL | 0 refills | Status: DC | PRN

## 2018-09-14 MED ORDER — SCOPOLAMINE 1 MG/3DAYS TD PT72: 1.0000 | MEDICATED_PATCH | Freq: Once | TRANSDERMAL | Status: DC | PRN

## 2018-09-14 MED ORDER — OXYMETAZOLINE HCL 0.05 % NA SOLN
NASAL | Status: DC | PRN
  Administered 2018-09-14: 1 via TOPICAL

## 2018-09-14 MED ORDER — ONDANSETRON HCL 4 MG/2ML IJ SOLN
INTRAMUSCULAR | Status: DC | PRN
  Administered 2018-09-14: 4 mg via INTRAVENOUS

## 2018-09-14 MED ORDER — LIDOCAINE-EPINEPHRINE 1 %-1:100000 IJ SOLN
INTRAMUSCULAR | Status: DC | PRN
  Administered 2018-09-14: 1 mL

## 2018-09-14 MED ORDER — MUPIROCIN 2 % EX OINT
TOPICAL_OINTMENT | CUTANEOUS | Status: DC | PRN
  Administered 2018-09-14: 1 via NASAL

## 2018-09-14 MED ORDER — CIPROFLOXACIN-FLUOCINOLONE PF 0.3-0.025 % OT SOLN
OTIC | Status: AC
  Filled 2018-09-14: qty 0.25

## 2018-09-14 MED ORDER — SUGAMMADEX SODIUM 200 MG/2ML IV SOLN
INTRAVENOUS | Status: AC
  Filled 2018-09-14: qty 2

## 2018-09-14 MED ORDER — DEXAMETHASONE SODIUM PHOSPHATE 4 MG/ML IJ SOLN
INTRAMUSCULAR | Status: DC | PRN
  Administered 2018-09-14: 10 mg via INTRAVENOUS

## 2018-09-14 MED ORDER — OXYCODONE HCL 5 MG PO TABS: 5.0000 mg | ORAL_TABLET | Freq: Once | ORAL | Status: DC | PRN

## 2018-09-14 MED ORDER — CEFAZOLIN SODIUM-DEXTROSE 2-3 GM-%(50ML) IV SOLR
INTRAVENOUS | Status: DC | PRN
  Administered 2018-09-14: 2 g via INTRAVENOUS

## 2018-09-14 MED ORDER — AMOXICILLIN 875 MG PO TABS: 875.0000 mg | ORAL_TABLET | Freq: Two times a day (BID) | ORAL | 0 refills | Status: AC

## 2018-09-14 MED ORDER — ROCURONIUM BROMIDE 100 MG/10ML IV SOLN
INTRAVENOUS | Status: DC | PRN
  Administered 2018-09-14: 30 mg via INTRAVENOUS

## 2018-09-14 MED ORDER — PROMETHAZINE HCL 25 MG/ML IJ SOLN: 6.2500 mg | INTRAMUSCULAR | Status: DC | PRN

## 2018-09-14 MED ORDER — PHENYLEPHRINE HCL 10 MG/ML IJ SOLN
INTRAMUSCULAR | Status: DC | PRN
  Administered 2018-09-14 (×2): 80 ug via INTRAVENOUS

## 2018-09-14 MED ORDER — FENTANYL CITRATE (PF) 100 MCG/2ML IJ SOLN: 25.0000 ug | INTRAMUSCULAR | Status: DC | PRN

## 2018-09-14 MED ORDER — LACTATED RINGERS IV SOLN
INTRAVENOUS | Status: DC
  Administered 2018-09-14 (×3): via INTRAVENOUS

## 2018-09-14 MED ORDER — ACETAMINOPHEN 10 MG/ML IV SOLN: 1000.0000 mg | Freq: Once | INTRAVENOUS | Status: DC | PRN

## 2018-09-14 MED ORDER — ONDANSETRON HCL 4 MG/2ML IJ SOLN
INTRAMUSCULAR | Status: AC
  Filled 2018-09-14: qty 2

## 2018-09-14 MED ORDER — PROPOFOL 10 MG/ML IV BOLUS
INTRAVENOUS | Status: DC | PRN
  Administered 2018-09-14: 120 mg via INTRAVENOUS

## 2018-09-14 MED ORDER — PROPOFOL 500 MG/50ML IV EMUL
INTRAVENOUS | Status: AC
  Filled 2018-09-14: qty 50

## 2018-09-14 MED ORDER — SUGAMMADEX SODIUM 200 MG/2ML IV SOLN
INTRAVENOUS | Status: DC | PRN
  Administered 2018-09-14: 200 mg via INTRAVENOUS

## 2018-09-14 MED ORDER — OXYMETAZOLINE HCL 0.05 % NA SOLN
NASAL | Status: AC
  Filled 2018-09-14: qty 30

## 2018-09-14 SURGICAL SUPPLY — 67 items
ATTRACTOMAT 16X20 MAGNETIC DRP (DRAPES) IMPLANT
BLADE ROTATE RAD 12 4 M4 (BLADE) IMPLANT
BLADE ROTATE RAD 12 4MM M4 (BLADE)
BLADE ROTATE RAD 40 4 M4 (BLADE) IMPLANT
BLADE ROTATE RAD 40 4MM M4 (BLADE)
BLADE ROTATE TRICUT 4MX13CM M4 (BLADE) ×1
BLADE ROTATE TRICUT 4X13 M4 (BLADE) ×2 IMPLANT
BLADE SURG 15 STRL LF DISP TIS (BLADE) IMPLANT
BLADE SURG 15 STRL SS (BLADE)
BLADE TRICUT ROTATE M4 4 5PK (BLADE) IMPLANT
BLADE TRICUT ROTATE M4 4MM 5PK (BLADE)
BUR HS RAD FRONTAL 3 (BURR) IMPLANT
BUR HS RAD FRONTAL 3MM (BURR)
CANISTER SUC SOCK COL 7IN (MISCELLANEOUS) ×6 IMPLANT
CANISTER SUCT 1200ML W/VALVE (MISCELLANEOUS) ×3 IMPLANT
COAGULATOR SUCT 8FR VV (MISCELLANEOUS) ×3 IMPLANT
COVER WAND RF STERILE (DRAPES) IMPLANT
DECANTER SPIKE VIAL GLASS SM (MISCELLANEOUS) IMPLANT
DRSG NASAL KENNEDY LMNT 8CM (GAUZE/BANDAGES/DRESSINGS) IMPLANT
DRSG NASOPORE 8CM (GAUZE/BANDAGES/DRESSINGS) ×2 IMPLANT
DRSG TELFA 3X8 NADH (GAUZE/BANDAGES/DRESSINGS) IMPLANT
ELECT REM PT RETURN 9FT ADLT (ELECTROSURGICAL) ×3
ELECTRODE REM PT RTRN 9FT ADLT (ELECTROSURGICAL) ×1 IMPLANT
GLOVE BIO SURGEON STRL SZ7.5 (GLOVE) ×3 IMPLANT
GLOVE BIOGEL PI IND STRL 6.5 (GLOVE) IMPLANT
GLOVE BIOGEL PI IND STRL 7.0 (GLOVE) IMPLANT
GLOVE BIOGEL PI INDICATOR 6.5 (GLOVE) ×2
GLOVE BIOGEL PI INDICATOR 7.0 (GLOVE) ×2
GLOVE ECLIPSE 6.5 STRL STRAW (GLOVE) ×2 IMPLANT
GLOVE SURG SS PI 7.0 STRL IVOR (GLOVE) ×2 IMPLANT
GOWN STRL REUS W/ TWL LRG LVL3 (GOWN DISPOSABLE) ×2 IMPLANT
GOWN STRL REUS W/TWL LRG LVL3 (GOWN DISPOSABLE) ×6
HEMOSTAT SURGICEL 2X14 (HEMOSTASIS) IMPLANT
IV NS 1000ML (IV SOLUTION)
IV NS 1000ML BAXH (IV SOLUTION) IMPLANT
IV NS 500ML (IV SOLUTION) ×3
IV NS 500ML BAXH (IV SOLUTION) ×1 IMPLANT
NDL HYPO 25X1 1.5 SAFETY (NEEDLE) ×1 IMPLANT
NDL SPNL 25GX3.5 QUINCKE BL (NEEDLE) IMPLANT
NEEDLE HYPO 25X1 1.5 SAFETY (NEEDLE) ×3 IMPLANT
NEEDLE SPNL 25GX3.5 QUINCKE BL (NEEDLE) IMPLANT
NS IRRIG 1000ML POUR BTL (IV SOLUTION) IMPLANT
PACK BASIN DAY SURGERY FS (CUSTOM PROCEDURE TRAY) ×3 IMPLANT
PACK ENT DAY SURGERY (CUSTOM PROCEDURE TRAY) ×3 IMPLANT
PAD DRESSING TELFA 3X8 NADH (GAUZE/BANDAGES/DRESSINGS) IMPLANT
SLEEVE SCD COMPRESS KNEE MED (MISCELLANEOUS) ×3 IMPLANT
SOLUTION BUTLER CLEAR DIP (MISCELLANEOUS) ×3 IMPLANT
SPLINT NASAL AIRWAY SILICONE (MISCELLANEOUS) ×3 IMPLANT
SPONGE GAUZE 2X2 8PLY STER LF (GAUZE/BANDAGES/DRESSINGS) ×1
SPONGE GAUZE 2X2 8PLY STRL LF (GAUZE/BANDAGES/DRESSINGS) ×2 IMPLANT
SPONGE NEURO XRAY DETECT 1X3 (DISPOSABLE) ×3 IMPLANT
SUCTION FRAZIER HANDLE 10FR (MISCELLANEOUS) ×2
SUCTION TUBE FRAZIER 10FR DISP (MISCELLANEOUS) IMPLANT
SUT CHROMIC 4 0 P 3 18 (SUTURE) ×3 IMPLANT
SUT PLAIN 4 0 ~~LOC~~ 1 (SUTURE) ×3 IMPLANT
SUT PROLENE 3 0 PS 2 (SUTURE) ×3 IMPLANT
SUT VIC AB 4-0 P-3 18XBRD (SUTURE) IMPLANT
SUT VIC AB 4-0 P3 18 (SUTURE)
SYR 50ML LL SCALE MARK (SYRINGE) ×3 IMPLANT
TOWEL GREEN STERILE FF (TOWEL DISPOSABLE) ×3 IMPLANT
TRACKER ENT INSTRUMENT (MISCELLANEOUS) ×3 IMPLANT
TRACKER ENT PATIENT (MISCELLANEOUS) ×3 IMPLANT
TUBE CONNECTING 20'X1/4 (TUBING) ×1
TUBE CONNECTING 20X1/4 (TUBING) ×2 IMPLANT
TUBE SALEM SUMP 16 FR W/ARV (TUBING) IMPLANT
TUBING STRAIGHTSHOT EPS 5PK (TUBING) ×3 IMPLANT
YANKAUER SUCT BULB TIP NO VENT (SUCTIONS) ×3 IMPLANT

## 2018-09-14 NOTE — Anesthesia Postprocedure Evaluation (Signed)
Anesthesia Post Note  Patient: Jose Hartman  Procedure(s) Performed: SINUS ENDO WITH FUSION (Bilateral Nose) SEPTOPLASTY WITH ETHMOIDECTOMY, AND MAXILLARY ANTROSTOMY,SPHENOIDECTOMY AND TISSUE REMOVAL AND FRONTAL RECESS EXLORATION (Bilateral Nose)     Patient location during evaluation: PACU Anesthesia Type: General Level of consciousness: awake and alert Pain management: pain level controlled Vital Signs Assessment: post-procedure vital signs reviewed and stable Respiratory status: spontaneous breathing, nonlabored ventilation and respiratory function stable Cardiovascular status: blood pressure returned to baseline and stable Postop Assessment: no apparent nausea or vomiting Anesthetic complications: no    Last Vitals:  Vitals:   09/14/18 1100 09/14/18 1128  BP: (!) 142/89 (!) 141/92  Pulse: 73 78  Resp: 14 18  Temp:  37.3 C  SpO2: 98% 99%    Last Pain:  Vitals:   09/14/18 1128  TempSrc:   PainSc: [place]

## 2018-09-14 NOTE — Op Note (Signed)
DATE OF PROCEDURE: 09/14/2018  OPERATIVE REPORT   SURGEON: Leta Baptist, MD   PREOPERATIVE DIAGNOSES:  1. Severe nasal septal deviation.  2. Bilateral chronic pansinusitis and polyposis. 3. Chronic nasal obstruction.  POSTOPERATIVE DIAGNOSES:  1. Severe nasal septal deviation.  2. Bilateral chronic pansinusitis and polyposis. 3. Chronic nasal obstruction.  PROCEDURE PERFORMED:  1. Bilateral endoscopic frontal sinusotomy with polyp removal. 2. Bilateral endoscopic total ethmoidectomy and sphenoidotomy with polyp removal. 3. Bilateral endoscopic maxillary antrostomy with polyp removal. 4. Septoplasty.  5. FUSION stereotactic image guidance.  ANESTHESIA: General endotracheal tube anesthesia.   COMPLICATIONS: None.   ESTIMATED BLOOD LOSS: 250 mL.   INDICATION FOR PROCEDURE: Jose Hartman is a 58 y.o. 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 The patient was previously treated with multiple courses of antibiotics and systemic steroids, without improvement in his symptoms. The patient also has significant nasal septal deviation, contributing to his chronic nasal obstruction. His CT scan showed generalized advanced sinusitis with numerous polyps in the nasal cavity and bilateral paranasal sinuses. All 4 paranasal sinuses were involved. Based on the above findings, the decision was made for the patient to undergo the above-stated procedures. The risks, benefits, alternatives, and details of the procedures were discussed with the patient. Questions were invited and answered. Informed consent was obtained.   DESCRIPTION OF PROCEDURE: The patient was taken to the operating room and placed supine on the operating table. General endotracheal tube anesthesia was administered by the anesthesiologist. The patient was positioned, and prepped and draped in the standard fashion for nasal surgery. Pledgets soaked with Afrin were placed in both nasal cavities for  decongestion. The pledgets were subsequently removed. The FUSION stereotactic image guidance marker was placed. The image guidance system was functional throughout the case.  Examination of the nasal cavity revealed a severe nasal septal deviationd. 1% lidocaine with 1:100,000 epinephrine was injected onto the nasal septum bilaterally. A hemitransfixion incision was made on the left side. The mucosal flap was carefully elevated on the left side. A cartilaginous incision was made 1 cm superior to the caudal margin of the nasal septum. Mucosal flap was also elevated on the right side in the similar fashion. It should be noted that due to the severe septal deviation, the deviated portion of the cartilaginous and bony septum had to be removed in piecemeal fashion. Once the deviated portions were removed, a straight midline septum was achieved. The septum was then quilted with 4-0 plain gut sutures. The hemitransfixion incision was closed with interrupted 4-0 chromic sutures.   Using a 0 endoscope, the left nasal cavity was examined. Polypoid tissue was noted within the nasal cavity and the middle meatus. The polypoid tissue was removed using a combination of microdebrider and Blakesley forceps. The uncinate process was resected with a freer elevator. The maxillary antrum was entered and enlarged using a combination of backbiter and microdebrider. Polypoid tissue was removed from the left maxillary sinus.  Attention was then focused on the ethmoid sinuses. The bony partitions of the anterior and posterior ethmoid cavities were taken down. Polypoid tissue was noted and removed.  More polyps were noted to obstruct the left sphenoid opening. The polyps were removed. The sphenoid opening was entered and enlarged. More polyps were removed from the sphenoid sinus. Attention was then focused on the frontal sinus. The frontal recess was identified and enlarged by removing the surrounding bony partitions. Polypoid tissue was  removed from the frontal recess. All 4 paranasal sinuses  were copiously irrigated with saline solution.  The same procedure was repeated on the right side without exception. More polyps were noted on the right side. All polyps were removed. Doyle splints were applied to the nasal septum.  The care of the patient was turned over to the anesthesiologist. The patient was awakened from anesthesia without difficulty. The patient was extubated and transferred to the recovery room in good condition.   OPERATIVE FINDINGS: Severe nasal septal deviation, bilateral chronic pansinusitis and polyposis.  SPECIMEN: Bilateral sinonasal contents.   FOLLOWUP CARE: The patient be discharged home once he is awake and alert. The patient will be placed on Percocet 1 tablets p.o. q.4 hours p.r.n. pain, and amoxicillin 875 mg p.o. b.i.d. for 5 days. The patient will follow up in my office in approximately 3 days for splint removal.   Jose Congrove Raynelle Bring, MD

## 2018-09-14 NOTE — Anesthesia Procedure Notes (Signed)
Procedure Name: Intubation Date/Time: 09/14/2018 8:19 AM Performed by: Maryella Shivers, CRNA Pre-anesthesia Checklist: Patient identified, Emergency Drugs available, Suction available and Patient being monitored Patient Re-evaluated:Patient Re-evaluated prior to induction Oxygen Delivery Method: Circle system utilized Preoxygenation: Pre-oxygenation with 100% oxygen Induction Type: IV induction Ventilation: Mask ventilation without difficulty Laryngoscope Size: Mac and 3 Grade View: Grade I Tube type: Oral Rae Tube size: 7.0 mm Number of attempts: 1 Airway Equipment and Method: Stylet and Oral airway Placement Confirmation: ETT inserted through vocal cords under direct vision,  positive ETCO2 and breath sounds checked- equal and bilateral Secured at: 21 cm Tube secured with: Tape Dental Injury: Teeth and Oropharynx as per pre-operative assessment

## 2018-09-14 NOTE — Transfer of Care (Signed)
Immediate Anesthesia Transfer of Care Note  Patient: Jose Hartman  Procedure(s) Performed: SINUS ENDO WITH FUSION (Bilateral Nose) SEPTOPLASTY WITH ETHMOIDECTOMY, AND MAXILLARY ANTROSTOMY,SPHENOIDECTOMY AND TISSUE REMOVAL AND FRONTAL RECESS EXLORATION (Bilateral Nose)  Patient Location: PACU  Anesthesia Type:General  Level of Consciousness: sedated  Airway & Oxygen Therapy: Patient Spontanous Breathing and Patient connected to face mask oxygen  Post-op Assessment: Report given to RN and Post -op Vital signs reviewed and stable  Post vital signs: Reviewed and stable  Last Vitals:  Vitals Value Taken Time  BP 135/82 09/14/2018 10:22 AM  Temp    Pulse 79 09/14/2018 10:23 AM  Resp 19 09/14/2018 10:23 AM  SpO2 100 % 09/14/2018 10:23 AM  Vitals shown include unvalidated device data.  Last Pain:  Vitals:   09/14/18 0651  TempSrc: Oral  PainSc: 0-No pain         Complications: No apparent anesthesia complications

## 2018-09-14 NOTE — H&P (Signed)
Cc: Chronic rhinosinusitis, polyposis, nasal obstruction  HPI: The patient is a 58 year old male who returns today for his follow-up evaluation. The patient was last seen 3 weeks ago.  At that time, he was noted to have chronic rhinosinusitis and extensive bilateral sinonasal polyps.  The patient was treated with a 12 day of Prednisone Dosepak.  He also underwent a sinus CT scan.  The patient returns today reporting a slight improvement in his nasal breathing.  His CT scan showed generalized advanced sinusitis with numerous polyps in the nasal cavity and bilateral paranasal sinuses.  All four paranasal sinuses were involved.  The patient also has severe anterior nasal septal deviation to the right.  The patient previously had his nasal polyps removed several years ago.  He is interested in more definitive treatment of his persistent nasal obstruction. No other ENT, GI, or respiratory issue noted since the last visit.   Exam: General: Communicates without difficulty, well nourished, no acute distress. Head: Normocephalic, no evidence injury, no tenderness, facial buttresses intact without stepoff. Eyes: PERRL, EOMI. No scleral icterus, conjunctivae clear. Neuro: CN II exam reveals vision grossly intact.  No nystagmus at any point of gaze. Ears: Auricles well formed without lesions.  Ear canals are intact without mass or lesion.  No erythema or edema is appreciated.  The TMs are intact without fluid. Nose: External evaluation reveals normal support and skin without lesions.  Dorsum is intact.  Anterior rhinoscopy reveals congested and edematous mucosa over anterior aspect of the inferior turbinates and nasal septum. Extensive nasal polyps. Oral:  Oral cavity and oropharynx are intact, symmetric, without erythema or edema.  Mucosa is moist without lesions. Neck: Full range of motion without pain.  There is no significant lymphadenopathy.  No masses palpable.  Thyroid bed within normal limits to palpation.   Parotid glands and submandibular glands equal bilaterally without mass.  Trachea is midline. Neuro:  CN 2-12 grossly intact. Gait normal. Vestibular: No nystagmus at any point of gaze.   Assessment  1.  Bilateral generalized advanced sinusitis with sinonasal polyps, filling all his paranasal sinuses and the nasal cavities.  2.  Severe septal deviation to the right.    Plan: 1.  The physical exam findings and the CT images are extensively discussed with the patient and his wife.  2.  The treatment options are reviewed.  The options include continuing medical and allergy management versus surgical intervention with bilateral endoscopic sinus surgery and septoplasty.  The patient has been using over-the-counter allergy medication and steroid nasal sprays in the past without improvement in his symptoms.  3.  The patient is interested in proceeding with surgical intervention.  The risks, benefits and details of the endoscopic sinus surgery and septoplasty procedures are extensively reviewed.  Questions are invited and answered.

## 2018-09-14 NOTE — Discharge Instructions (Signed)
Post Anesthesia Home Care Instructions  Activity: Get plenty of rest for the remainder of the day. A responsible individual must stay with you for 24 hours following the procedure.  For the next 24 hours, DO NOT: -Drive a car -Paediatric nurse -Drink alcoholic beverages -Take any medication unless instructed by your physician -Make any legal decisions or sign important papers.  Meals: Start with liquid foods such as gelatin or soup. Progress to regular foods as tolerated. Avoid greasy, spicy, heavy foods. If nausea and/or vomiting occur, drink only clear liquids until the nausea and/or vomiting subsides. Call your physician if vomiting continues.  Special Instructions/Symptoms: Your throat may feel dry or sore from the anesthesia or the breathing tube placed in your throat during surgery. If this causes discomfort, gargle with warm salt water. The discomfort should disappear within 24 hours.  If you had a scopolamine patch placed behind your ear for the management of post- operative nausea and/or vomiting:  1. The medication in the patch is effective for 72 hours, after which it should be removed.  Wrap patch in a tissue and discard in the trash. Wash hands thoroughly with soap and water. 2. You may remove the patch earlier than 72 hours if you experience unpleasant side effects which may include dry mouth, dizziness or visual disturbances. 3. Avoid touching the patch. Wash your hands with soap and water after contact with the patch.    Call your surgeon if you experience:   1.  Fever over 101.0. 2.  Inability to urinate. 3.  Nausea and/or vomiting. 4.  Extreme swelling or bruising at the surgical site. 5.  Continued bleeding from the incision. 6.  Increased pain, redness or drainage from the incision. 7.  Problems related to your pain medication. 8.  Any problems and/or concerns  -----------  POSTOPERATIVE INSTRUCTIONS FOR PATIENTS HAVING NASAL OR SINUS OPERATIONS ACTIVITY:  Restrict activity at home for the first two days, resting as much as possible. Light activity is best. You may usually return to work within a week. You should refrain from nose blowing, strenuous activity, or heavy lifting greater than 20lbs for a total of three weeks after your operation.  If sneezing cannot be avoided, sneeze with your mouth open. DISCOMFORT: You may experience a dull headache and pressure along with nasal congestion and discharge. These symptoms may be worse during the first week after the operation but may last as long as two to four weeks.  Please take Tylenol or the pain medication that has been prescribed for you. Do not take aspirin or aspirin containing medications since they may cause bleeding.  You may experience symptoms of post nasal drainage, nasal congestion, headaches and fatigue for two or three months after your operation.  BLEEDING: You may have some blood tinged nasal drainage for approximately two weeks after the operation.  The discharge will be worse for the first week.  Please call our office at [phone] or go to the nearest hospital emergency room if you experience any of the following: heavy, bright red blood from your nose or mouth that lasts longer than ten minutes or coughing up or vomiting bright red blood or blood clots. GENERAL CONSIDERATIONS: 1. A gauze dressing will be placed on your upper lip to absorb any drainage after the operation. You may need to change this several times a day.  If you do not have very much drainage, you may remove the dressing.  Remember that you may gently wipe your nose with a  tissue and sniff in, but DO NOT blow your nose. 2. Please keep all of your postoperative appointments.  Your final results after the operation will depend on proper follow-up.  The initial visit is usually four to seven days after the operation.  During this visit, the remaining nasal packing and internal septal splints will be removed.  Your nasal and  sinus cavities will be cleaned.  During the second visit, your nasal and sinus cavities will be cleaned again. Have someone drive you to your first two postoperative appointments. We suggest that you take your prescribed pain medication about  hour prior to each of these two appointments.  3. How you care for your nose after the operation will influence the results that you obtain.  You should follow all directions, take your medication as prescribed, and call our office [phone] with any problems or questions. 4. You may be more comfortable sleeping with your head elevated on two pillows. 5. Do not take any medications that we have not prescribed or recommended. WARNING SIGNS: if any of the following should occur, please call our office: 1. Bright red bleeding which lasts more than 10 minutes. 2. Persistent fever greater than 102F. 3. Persistent vomiting. 4. Severe and constant pain that is not relieved by prescribed pain medication. 5. Trauma to the nose. 6. Rash or unusual side effects from any medicines.

## 2018-09-15 ENCOUNTER — Encounter (HOSPITAL_BASED_OUTPATIENT_CLINIC_OR_DEPARTMENT_OTHER): Payer: Self-pay | Admitting: Otolaryngology

## 2019-08-07 ENCOUNTER — Emergency Department (HOSPITAL_BASED_OUTPATIENT_CLINIC_OR_DEPARTMENT_OTHER)
Admission: EM | Admit: 2019-08-07 | Discharge: 2019-08-07 | Disposition: A | Discharge status: Home / Self Care | Payer: Medicare Other | Attending: Emergency Medicine | Admitting: Emergency Medicine

## 2019-08-07 ENCOUNTER — Encounter (HOSPITAL_BASED_OUTPATIENT_CLINIC_OR_DEPARTMENT_OTHER): Payer: Self-pay | Admitting: Emergency Medicine

## 2019-08-07 ENCOUNTER — Other Ambulatory Visit: Payer: Self-pay

## 2019-08-07 DIAGNOSIS — Z23 Encounter for immunization: Secondary | ICD-10-CM | POA: Insufficient documentation

## 2019-08-07 DIAGNOSIS — L02519 Cutaneous abscess of unspecified hand: Secondary | ICD-10-CM

## 2019-08-07 DIAGNOSIS — F17220 Nicotine dependence, chewing tobacco, uncomplicated: Secondary | ICD-10-CM | POA: Insufficient documentation

## 2019-08-07 DIAGNOSIS — Z79899 Other long term (current) drug therapy: Secondary | ICD-10-CM | POA: Insufficient documentation

## 2019-08-07 DIAGNOSIS — Z91010 Allergy to peanuts: Secondary | ICD-10-CM | POA: Diagnosis not present

## 2019-08-07 DIAGNOSIS — R2232 Localized swelling, mass and lump, left upper limb: Secondary | ICD-10-CM | POA: Diagnosis present

## 2019-08-07 DIAGNOSIS — L03114 Cellulitis of left upper limb: Secondary | ICD-10-CM | POA: Insufficient documentation

## 2019-08-07 MED ORDER — TETANUS-DIPHTH-ACELL PERTUSSIS 5-2.5-18.5 LF-MCG/0.5 IM SUSP
0.5000 mL | Freq: Once | INTRAMUSCULAR | Status: AC
  Administered 2019-08-07: 0.5 mL via INTRAMUSCULAR
  Filled 2019-08-07: qty 0.5

## 2019-08-07 MED ORDER — DOXYCYCLINE HYCLATE 100 MG PO TABS
100.0000 mg | ORAL_TABLET | Freq: Once | ORAL | Status: AC
  Administered 2019-08-07: 100 mg via ORAL
  Filled 2019-08-07: qty 1

## 2019-08-07 MED ORDER — DOXYCYCLINE HYCLATE 100 MG PO CAPS: 100.0000 mg | ORAL_CAPSULE | Freq: Two times a day (BID) | ORAL | 0 refills | Status: DC

## 2019-08-07 NOTE — ED Triage Notes (Signed)
He scratched his L hand on some hog wire 2 days ago. Started swelling and itching yesterday.

## 2019-08-07 NOTE — ED Provider Notes (Signed)
[facility] EMERGENCY DEPARTMENT Provider Note   CSN: [id number] Arrival date & time: 08/07/19  1219     History Chief Complaint  Patient presents with  . hand swelling    Jose Hartman is a 58 y.o. male.  58 yo M with a chief complaints of a wound to his left dorsal hand.  The patient with working with some wire that used to make a cage for kittens and he bumped his hand to get it then had a small cut to the area.  This was a few days ago.  Since then the area around it is gotten swollen and red.  He ended up opening up the initial wound and had some blood drainage but nothing else.  Denies fevers.  Unsure of his last tetanus.  He denies any chance of foreign body.  The history is provided by the patient.  Illness Severity:  Mild Onset quality:  Gradual Duration:  3 days Timing:  Constant Progression:  Worsening Chronicity:  New Associated symptoms: no abdominal pain, no chest pain, no congestion, no diarrhea, no fever, no headaches, no myalgias, no rash, no shortness of breath and no vomiting        Past Medical History:  Diagnosis Date  . Arthritis    left arm   . GERD (gastroesophageal reflux disease)   . History of kidney stones   . S/P balloon dilatation of esophageal stricture     Patient Active Problem List   Diagnosis Date Noted  . Left nasal polyps 02/04/2016  . Left LBP 01/16/2016  . 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 Recurrent kidney stones 12/11/2015  . Asthma 12/11/2015  . Sinusitis, chronic 12/11/2015    Past Surgical History:  Procedure Laterality Date  . BALLOON DILATION N/A 08/13/2018   Procedure: BALLOON DILATION;  Surgeon: Ronnette Juniper, MD;  Location: [facility];  Service: Gastroenterology;  Laterality: N/A;  . COLONOSCOPY N/A 08/13/2018   Procedure: COLONOSCOPY;  Surgeon: Ronnette Juniper, MD;  Location: [facility];  Service: Gastroenterology;  Laterality: N/A;  . CYSTOSCOPY/RETROGRADE/URETEROSCOPY/STONE EXTRACTION WITH BASKET Left  09/09/2013   Procedure: CYSTOSCOPY/LEFT RETROGRADE PYELOGRAM /FLEXIBLE URETEROSCOPY/INSERTION LEFT URETERAL STENT;  Surgeon: Ardis Hughs, MD;  Location: [facility];  Service: Urology;  Laterality: Left;  . ESOPHAGOGASTRODUODENOSCOPY Left 05/15/2017   Procedure: ESOPHAGOGASTRODUODENOSCOPY (EGD);  Surgeon: Ronnette Juniper, MD;  Location: [facility];  Service: Gastroenterology;  Laterality: Left;  . ESOPHAGOGASTRODUODENOSCOPY N/A 08/13/2018   Procedure: ESOPHAGOGASTRODUODENOSCOPY (EGD);  Surgeon: Ronnette Juniper, MD;  Location: [facility];  Service: Gastroenterology;  Laterality: N/A;  . EXTRACORPOREAL SHOCK WAVE LITHOTRIPSY    . FRACTURE SURGERY     left wrist  . POLYPECTOMY  08/13/2018   Procedure: POLYPECTOMY;  Surgeon: Ronnette Juniper, MD;  Location: [facility];  Service: Gastroenterology;;  . SEPTOPLASTY WITH ETHMOIDECTOMY, AND MAXILLARY ANTROSTOMY Bilateral 09/14/2018   Procedure: SEPTOPLASTY WITH ETHMOIDECTOMY, AND MAXILLARY ANTROSTOMY,SPHENOIDECTOMY AND TISSUE REMOVAL AND FRONTAL RECESS Pilar Grammes;  Surgeon: Leta Baptist, MD;  Location: [facility];  Service: ENT;  Laterality: Bilateral;  . SINUS ENDO WITH FUSION Bilateral 09/14/2018   Procedure: SINUS ENDO WITH FUSION;  Surgeon: Leta Baptist, MD;  Location: [facility];  Service: ENT;  Laterality: Bilateral;  . Uretheral Stent placement         Family History  Problem Relation Age of Onset  . Heart disease Other   . Diabetes Mellitus II Other   . Esophageal cancer Brother     Social History   Tobacco Use  .  Smoking status: Never Smoker  . Smokeless tobacco: Current User    Types: Chew  Substance Use Topics  . Alcohol use: Yes    Comment: rare beer  . Drug use: No    Home Medications Prior to Admission medications   Medication Sig Start Date End Date Taking? Authorizing Provider  doxycycline (VIBRAMYCIN) 100 MG capsule Take 1 capsule (100 mg total) by mouth 2 (two) times daily. One po bid x 7 days 08/07/19   Deno Etienne, DO  oxyCODONE-acetaminophen (PERCOCET) 5-325 MG tablet Take 1 tablet by mouth every 4 (four) hours as needed for severe pain. 09/14/18   Leta Baptist, MD  pantoprazole (PROTONIX) 40 MG tablet Take 40 mg by mouth daily.    [provider]    Allergies    Benadryl [diphenhydramine hcl] and Peanuts [peanut oil]  Review of Systems   Review of Systems  Constitutional: Negative for chills and fever.  HENT: Negative for congestion and facial swelling.   Eyes: Negative for discharge and visual disturbance.  Respiratory: Negative for shortness of breath.   Cardiovascular: Negative for chest pain and palpitations.  Gastrointestinal: Negative for abdominal pain, diarrhea and vomiting.  Musculoskeletal: Negative for arthralgias and myalgias.  Skin: Positive for color change and wound. Negative for rash.  Neurological: Negative for tremors, syncope and headaches.  Psychiatric/Behavioral: Negative for confusion and dysphoric mood.    Physical Exam Updated Vital Signs BP 128/88 (BP Location: Right Arm)   Pulse 76   Temp 99.1 F (37.3 C) (Oral)   Resp 18   Ht 5\' 6"  (1.676 m)   Wt 64.4 kg   SpO2 100%   BMI 22.92 kg/m   Physical Exam Vitals and nursing note reviewed.  Constitutional:      Appearance: He is well-developed.  HENT:     Head: Normocephalic and atraumatic.  Eyes:     Pupils: Pupils are equal, round, and reactive to light.  Neck:     Vascular: No JVD.  Cardiovascular:     Rate and Rhythm: Normal rate and regular rhythm.     Heart sounds: No murmur. No friction rub. No gallop.   Pulmonary:     Effort: No respiratory distress.     Breath sounds: No wheezing.  Abdominal:     General: There is no distension.     Tenderness: There is no guarding or rebound.  Musculoskeletal:        General: Swelling present. No tenderness. Normal range of motion.     Cervical back: Normal range of motion and neck supple.     Comments: Swelling and erythema to the dorsal aspect of  the left hand.  There is a small wound that is in the process of healing.  No expressible discharge.  No fluctuance.  Small amount of induration and some edema that extends just distal to the wrist.  Skin:    Coloration: Skin is not pale.     Findings: No rash.  Neurological:     Mental Status: He is alert and oriented to person, place, and time.  Psychiatric:        Behavior: Behavior normal.     ED Results / Procedures / Treatments   Labs (all labs ordered are listed, but only abnormal results are displayed) Labs Reviewed - No data to display  EKG None  Radiology No results found.  Procedures Procedures (including critical care time)  Medications Ordered in ED Medications  Tdap (BOOSTRIX) injection 0.5 mL (has no administration  in time range)  doxycycline (VIBRA-TABS) tablet 100 mg (has no administration in time range)    ED Course  I have reviewed the triage vital signs and the nursing notes.  Pertinent labs & imaging results that were available during my care of the patient were reviewed by me and considered in my medical decision making (see chart for details).    MDM Rules/Calculators/A&P                      58 yo M with a chief complaints of a wound to the hand.  Most likely the patient has cellulitis, will treat with antibiotics.  Warm compresses.  I offered to perform an image to rule out foreign body which he is declining.  Update his tetanus.  Discharge home.   12:54 PM:  I have discussed the diagnosis/risks/treatment options with the patient and believe the pt to be eligible for discharge home to follow-up with PCP. We also discussed returning to the ED immediately if new or worsening sx occur. We discussed the sx which are most concerning (e.g., sudden worsening pain, fever, rapid spreading redness) that necessitate immediate return. Medications administered to the patient during their visit and any new prescriptions provided to the patient are listed  below.  Medications given during this visit Medications  Tdap (BOOSTRIX) injection 0.5 mL (has no administration in time range)  doxycycline (VIBRA-TABS) tablet 100 mg (has no administration in time range)     The patient appears reasonably screen and/or stabilized for discharge and I doubt any other medical condition or other [facility] requiring further screening, evaluation, or treatment in the ED at this time prior to discharge.   Final Clinical Impression(s) / ED Diagnoses Final diagnoses:  Cellulitis and abscess of hand    Rx / DC Orders ED Discharge Orders         Ordered    doxycycline (VIBRAMYCIN) 100 MG capsule  2 times daily     08/07/19 Colquitt, Elliott, DO 08/07/19 1254

## 2019-08-07 NOTE — Discharge Instructions (Signed)
Warm compresses at least 4 times a day.  Return for rapid spreading redness or fever.  °

## 2019-08-07 NOTE — ED Notes (Signed)
ED Provider at bedside. 

## 2019-10-31 ENCOUNTER — Emergency Department (HOSPITAL_COMMUNITY): Payer: Medicare Other

## 2019-10-31 ENCOUNTER — Other Ambulatory Visit: Payer: Self-pay

## 2019-10-31 ENCOUNTER — Emergency Department (HOSPITAL_COMMUNITY)
Admission: EM | Admit: 2019-10-31 | Discharge: 2019-10-31 | Disposition: A | Discharge status: Home / Self Care | Payer: Medicare Other | Attending: Emergency Medicine | Admitting: Emergency Medicine

## 2019-10-31 DIAGNOSIS — R531 Weakness: Secondary | ICD-10-CM | POA: Insufficient documentation

## 2019-10-31 DIAGNOSIS — Z79899 Other long term (current) drug therapy: Secondary | ICD-10-CM | POA: Diagnosis not present

## 2019-10-31 DIAGNOSIS — G90512 Complex regional pain syndrome I of left upper limb: Secondary | ICD-10-CM | POA: Insufficient documentation

## 2019-10-31 DIAGNOSIS — H532 Diplopia: Secondary | ICD-10-CM | POA: Insufficient documentation

## 2019-10-31 DIAGNOSIS — R200 Anesthesia of skin: Secondary | ICD-10-CM | POA: Diagnosis not present

## 2019-10-31 DIAGNOSIS — H538 Other visual disturbances: Secondary | ICD-10-CM | POA: Diagnosis not present

## 2019-10-31 DIAGNOSIS — F17290 Nicotine dependence, other tobacco product, uncomplicated: Secondary | ICD-10-CM | POA: Insufficient documentation

## 2019-10-31 DIAGNOSIS — R42 Dizziness and giddiness: Secondary | ICD-10-CM | POA: Diagnosis present

## 2019-10-31 LAB — RAPID URINE DRUG SCREEN, HOSP PERFORMED
Amphetamines: NOT DETECTED
Barbiturates: NOT DETECTED
Benzodiazepines: NOT DETECTED
Cocaine: NOT DETECTED
Opiates: NOT DETECTED
Tetrahydrocannabinol: POSITIVE — AB

## 2019-10-31 LAB — PROTIME-INR
INR: 1 (ref 0.8–1.2)
Prothrombin Time: 13 seconds (ref 11.4–15.2)

## 2019-10-31 LAB — DIFFERENTIAL
Abs Immature Granulocytes: 0.02 10*3/uL (ref 0.00–0.07)
Basophils Absolute: 0.1 10*3/uL (ref 0.0–0.1)
Basophils Relative: 1 %
Eosinophils Absolute: 0.3 10*3/uL (ref 0.0–0.5)
Eosinophils Relative: 6 %
Immature Granulocytes: 0 %
Lymphocytes Relative: 23 %
Lymphs Abs: 1.2 10*3/uL (ref 0.7–4.0)
Monocytes Absolute: 0.5 10*3/uL (ref 0.1–1.0)
Monocytes Relative: 10 %
Neutro Abs: 3.1 10*3/uL (ref 1.7–7.7)
Neutrophils Relative %: 60 %

## 2019-10-31 LAB — CBC
HCT: 46 % (ref 39.0–52.0)
Hemoglobin: 15.2 g/dL (ref 13.0–17.0)
MCH: 30.5 pg (ref 26.0–34.0)
MCHC: 33 g/dL (ref 30.0–36.0)
MCV: 92.2 fL (ref 80.0–100.0)
Platelets: 271 10*3/uL (ref 150–400)
RBC: 4.99 MIL/uL (ref 4.22–5.81)
RDW: 12.6 % (ref 11.5–15.5)
WBC: 5.3 10*3/uL (ref 4.0–10.5)
nRBC: 0 % (ref 0.0–0.2)

## 2019-10-31 LAB — URINALYSIS, ROUTINE W REFLEX MICROSCOPIC
Bilirubin Urine: NEGATIVE
Glucose, UA: NEGATIVE mg/dL
Hgb urine dipstick: NEGATIVE
Ketones, ur: NEGATIVE mg/dL
Leukocytes,Ua: NEGATIVE
Nitrite: NEGATIVE
Protein, ur: NEGATIVE mg/dL
Specific Gravity, Urine: 1.006 (ref 1.005–1.030)
pH: 5 (ref 5.0–8.0)

## 2019-10-31 LAB — COMPREHENSIVE METABOLIC PANEL
ALT: 18 U/L (ref 0–44)
AST: 19 U/L (ref 15–41)
Albumin: 4.4 g/dL (ref 3.5–5.0)
Alkaline Phosphatase: 66 U/L (ref 38–126)
Anion gap: 8 (ref 5–15)
BUN: 14 mg/dL (ref 6–20)
CO2: 28 mmol/L (ref 22–32)
Calcium: 9.5 mg/dL (ref 8.9–10.3)
Chloride: 106 mmol/L (ref 98–111)
Creatinine, Ser: 0.7 mg/dL (ref 0.61–1.24)
GFR calc Af Amer: >60 mL/min (ref >60)
GFR calc non Af Amer: >60 mL/min (ref >60)
Glucose, Bld: 100 mg/dL — ABNORMAL HIGH (ref 70–99)
Potassium: 4.3 mmol/L (ref 3.5–5.1)
Sodium: 142 mmol/L (ref 135–145)
Total Bilirubin: 1 mg/dL (ref 0.3–1.2)
Total Protein: 7.5 g/dL (ref 6.5–8.1)

## 2019-10-31 LAB — ETHANOL: Alcohol, Ethyl (B): <10 mg/dL (ref <10)

## 2019-10-31 LAB — APTT: aPTT: 25 seconds (ref 24–36)

## 2019-10-31 LAB — CBG MONITORING, ED: Glucose-Capillary: 91 mg/dL (ref 70–99)

## 2019-10-31 MED ORDER — IOHEXOL 350 MG/ML SOLN
80.0000 mL | Freq: Once | INTRAVENOUS | Status: AC | PRN
  Administered 2019-10-31: 80 mL via INTRAVENOUS

## 2019-10-31 NOTE — ED Provider Notes (Signed)
Patient care transferred to me. Patient with transient double vision multiple days ago. No symptoms now. CTA without obvious large vessel occlusion, stenosis. Will d/c home with outpatient neuro f/u as planned   Sherwood Gambler, MD 10/31/19 1806

## 2019-10-31 NOTE — ED Triage Notes (Signed)
Pt ambulatory into ED CC Possible stroke like symptoms occurring Thursday 3/18. Pt reports " I was getting into shower and I felt my right eye dropping, when I looked in the mirror I was seeing double for about 15 minutes then everything went away". Pt denies similar symptoms numbness or tingling but endorses light headedness and fatigue.    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

## 2019-10-31 NOTE — ED Notes (Signed)
Pt transported to CT ?

## 2019-10-31 NOTE — Discharge Instructions (Addendum)
You were seen in the emergency department for evaluation of 20 minutes of double vision with some right eye droop.  Your work-up did not show any serious findings.  Neurology was consulted and they recommended you follow-up outpatient with the neurology clinic.  We have placed a referral for that.  If you experience any recurrence of your symptoms or any new concerning symptoms please return to the emergency department

## 2019-10-31 NOTE — ED Provider Notes (Signed)
[facility] DEPT Provider Note   CSN: [id number] Arrival date & time: 10/31/19  1254     History No chief complaint on file.   Jose Hartman is a 59 y.o. male.  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  He is complaining of an episode 4 days ago of right eye lid drooping and diplopia that lasted about 20 minutes.  He has never had it before and it has not recurred since then.  It was not associated with any other focal numbness or weakness.  No difficulty with speech.  Since then he has felt a little lightheaded.  No headache blurry vision difficulty speaking or swallowing or other further numbness or weakness except for his left upper extremity chronic condition.  He talked to his PCP today who recommended he come to the hospital for evaluation.  The history is provided by the patient.  Cerebrovascular Accident This is a new problem. The current episode started more than 2 days ago. The problem has been resolved. Pertinent negatives include no chest pain, no abdominal pain, no headaches and no shortness of breath. Nothing aggravates the symptoms. Nothing relieves the symptoms. He has tried nothing for the symptoms. The treatment provided no relief.       Past Medical History:  Diagnosis Date  . Arthritis    left arm   . GERD (gastroesophageal reflux disease)   . History of kidney stones   . S/P balloon dilatation of esophageal stricture     Patient Active Problem List   Diagnosis Date Noted  . Left nasal polyps 02/04/2016  . Left LBP 01/16/2016  . 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 Recurrent kidney stones 12/11/2015  . Asthma 12/11/2015  . Sinusitis, chronic 12/11/2015    Past Surgical History:  Procedure Laterality Date  . BALLOON DILATION N/A 08/13/2018   Procedure: BALLOON DILATION;  Surgeon: Ronnette Juniper, MD;  Location: [facility];  Service: Gastroenterology;  Laterality: N/A;    . COLONOSCOPY N/A 08/13/2018   Procedure: COLONOSCOPY;  Surgeon: Ronnette Juniper, MD;  Location: [facility];  Service: Gastroenterology;  Laterality: N/A;  . CYSTOSCOPY/RETROGRADE/URETEROSCOPY/STONE EXTRACTION WITH BASKET Left 09/09/2013   Procedure: CYSTOSCOPY/LEFT RETROGRADE PYELOGRAM /FLEXIBLE URETEROSCOPY/INSERTION LEFT URETERAL STENT;  Surgeon: Ardis Hughs, MD;  Location: [facility];  Service: Urology;  Laterality: Left;  . ESOPHAGOGASTRODUODENOSCOPY Left 05/15/2017   Procedure: ESOPHAGOGASTRODUODENOSCOPY (EGD);  Surgeon: Ronnette Juniper, MD;  Location: [facility];  Service: Gastroenterology;  Laterality: Left;  . ESOPHAGOGASTRODUODENOSCOPY N/A 08/13/2018   Procedure: ESOPHAGOGASTRODUODENOSCOPY (EGD);  Surgeon: Ronnette Juniper, MD;  Location: [facility];  Service: Gastroenterology;  Laterality: N/A;  . EXTRACORPOREAL SHOCK WAVE LITHOTRIPSY    . FRACTURE SURGERY     left wrist  . POLYPECTOMY  08/13/2018   Procedure: POLYPECTOMY;  Surgeon: Ronnette Juniper, MD;  Location: [facility];  Service: Gastroenterology;;  . SEPTOPLASTY WITH ETHMOIDECTOMY, AND MAXILLARY ANTROSTOMY Bilateral 09/14/2018   Procedure: SEPTOPLASTY WITH ETHMOIDECTOMY, AND MAXILLARY ANTROSTOMY,SPHENOIDECTOMY AND TISSUE REMOVAL AND FRONTAL RECESS Pilar Grammes;  Surgeon: Leta Baptist, MD;  Location: [facility];  Service: ENT;  Laterality: Bilateral;  . SINUS ENDO WITH FUSION Bilateral 09/14/2018   Procedure: SINUS ENDO WITH FUSION;  Surgeon: Leta Baptist, MD;  Location: [facility];  Service: ENT;  Laterality: Bilateral;  . Uretheral Stent placement         Family History  Problem Relation Age of Onset  . Heart disease Other   .  Diabetes Mellitus II Other   . Esophageal cancer Brother     Social History   Tobacco Use  . Smoking status: Never Smoker  . Smokeless tobacco: Current User    Types: Chew  Substance Use Topics  . Alcohol use: Yes    Comment: rare beer  . Drug use: No    Home Medications Prior to  Admission medications   Medication Sig Start Date End Date Taking? Authorizing Provider  doxycycline (VIBRAMYCIN) 100 MG capsule Take 1 capsule (100 mg total) by mouth 2 (two) times daily. One po bid x 7 days 08/07/19   Deno Etienne, DO  oxyCODONE-acetaminophen (PERCOCET) 5-325 MG tablet Take 1 tablet by mouth every 4 (four) hours as needed for severe pain. 09/14/18   Leta Baptist, MD  pantoprazole (PROTONIX) 40 MG tablet Take 40 mg by mouth daily.    [provider]    Allergies    Benadryl [diphenhydramine hcl] and Peanuts [peanut oil]  Review of Systems   Review of Systems  Constitutional: Negative for fever.  HENT: Negative for sore throat.   Eyes: Positive for visual disturbance.  Respiratory: Negative for shortness of breath.   Cardiovascular: Negative for chest pain.  Gastrointestinal: Negative for abdominal pain.  Genitourinary: Negative for dysuria.  Musculoskeletal: Negative for neck pain.  Skin: Negative for rash.  Neurological: Positive for weakness, light-headedness and numbness. Negative for headaches.    Physical Exam Updated Vital Signs BP 115/80   Pulse (!) 57   Temp 98.1 F (36.7 C) (Oral)   Resp 18   SpO2 99%   Physical Exam Vitals and nursing note reviewed.  Constitutional:      Appearance: He is well-developed.  HENT:     Head: Normocephalic and atraumatic.  Eyes:     Conjunctiva/sclera: Conjunctivae normal.  Cardiovascular:     Rate and Rhythm: Normal rate and regular rhythm.     Heart sounds: No murmur.  Pulmonary:     Effort: Pulmonary effort is normal. No respiratory distress.     Breath sounds: Normal breath sounds.  Abdominal:     Palpations: Abdomen is soft.     Tenderness: There is no abdominal tenderness.  Musculoskeletal:        General: No deformity. Normal range of motion.     Cervical back: Neck supple.  Skin:    General: Skin is warm and dry.     Capillary Refill: Capillary refill takes less than 2 seconds.  Neurological:      Mental Status: He is alert and oriented to person, place, and time. Mental status is at baseline.     Cranial Nerves: No cranial nerve deficit.     Sensory: Sensory deficit present.     Motor: Weakness present.     Comments: He has some slight weakness to his left upper extremity with some subjective decreased sensation.  He says this is a baseline.  No other appreciable focal deficits.     ED Results / Procedures / Treatments   Labs (all labs ordered are listed, but only abnormal results are displayed) Labs Reviewed  COMPREHENSIVE METABOLIC PANEL - Abnormal; Notable for the following components:      Result Value   Glucose, Bld 100 (*)    All other components within normal limits  RAPID URINE DRUG SCREEN, HOSP PERFORMED - Abnormal; Notable for the following components:   Tetrahydrocannabinol POSITIVE (*)    All other components within normal limits  URINALYSIS, ROUTINE W REFLEX MICROSCOPIC - Abnormal;  Notable for the following components:   Color, Urine STRAW (*)    All other components within normal limits  ETHANOL  PROTIME-INR  APTT  CBC  DIFFERENTIAL  CBG MONITORING, ED    EKG EKG Interpretation  Date/Time:  Monday October 31 2019 13:25:18 EDT Ventricular Rate:  68 PR Interval:    QRS Duration: 91 QT Interval:  369 QTC Calculation: 393 R Axis:   -25 Text Interpretation: Sinus rhythm Borderline left axis deviation No significant change since 4/17 Confirmed by Aletta Edouard [phone]) on 10/31/2019 1:46:19 PM   Radiology CT Angio Head W/Cm &/Or Wo Cm  Result Date: 10/31/2019 CLINICAL DATA:  Transient ischemic attack (TIA). Additional history provided: Patient reports right eye "dropping," double vision, lightheadedness, fatigue. EXAM: CT ANGIOGRAPHY HEAD AND NECK TECHNIQUE: Multidetector CT imaging of the head and neck was performed using the standard protocol during bolus administration of intravenous contrast. Multiplanar CT image reconstructions and MIPs were obtained  to evaluate the vascular anatomy. Carotid stenosis measurements (when applicable) are obtained utilizing NASCET criteria, using the distal internal carotid diameter as the denominator. CONTRAST:  28mL OMNIPAQUE IOHEXOL 350 MG/ML SOLN COMPARISON:  Noncontrast head CT performed earlier the same day 10/31/2019 FINDINGS: CTA NECK FINDINGS Aortic arch: Standard aortic branching. The visualized aortic arch is unremarkable. No significant innominate or proximal subclavian artery stenosis. Right carotid system: CCA and ICA patent within the neck without stenosis. Left carotid system: CCA and ICA patent within the neck without stenosis. Minimal calcified plaque at the left carotid bifurcation. Vertebral arteries: The left vertebral artery is subtly dominant. The vertebral arteries are patent within the neck without stenosis. Skeleton: Cervical spondylosis with shallow multilevel posterior disc osteophytes and uncovertebral spurring. Other neck: No neck mass or cervical lymphadenopathy. Upper chest: No consolidation within the imaged lung apices. Review of the MIP images confirms the above findings CTA HEAD FINDINGS Anterior circulation: The intracranial internal carotid arteries are patent without stenosis. Minimal calcified plaque within these vessels. The M1 middle cerebral arteries are patent without significant stenosis. No M2 proximal branch occlusion or high-grade proximal stenosis is identified. The anterior cerebral arteries are patent without significant proximal stenosis. No intracranial aneurysm is identified. Posterior circulation: The intracranial vertebral arteries are patent without significant stenosis. The basilar artery is developmentally diminutive but patent. Fetal origin posterior cerebral arteries bilaterally which are patent without significant proximal stenosis. Venous sinuses: Within limitations of contrast timing, no convincing thrombus. Anatomic variants: As described Review of the MIP images  confirms the above findings IMPRESSION: CTA neck: The bilateral common carotid, internal carotid and vertebral arteries are patent within the neck without significant stenosis. Minimal calcified plaque at the left carotid bifurcation. CTA head: 1. No intracranial large vessel occlusion or high-grade proximal arterial stenosis. 2. 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 Electronically Signed   By: Kellie Simmering DO   On: 10/31/2019 17:41   CT Head Wo Contrast  Result Date: 10/31/2019 CLINICAL DATA:  Possible stroke like symptoms, visual disturbance EXAM: CT HEAD WITHOUT CONTRAST TECHNIQUE: Contiguous axial images were obtained from the base of the skull through the vertex without intravenous contrast. COMPARISON:  None. FINDINGS: Brain: There is no acute intracranial hemorrhage, mass effect, or edema. Gray-white differentiation is preserved. There is no extra-axial fluid collection. Ventricles and sulci are within normal limits in size and configuration. Vascular: No hyperdense vessel or unexpected calcification. Skull: Calvarium is unremarkable. Sinuses/Orbits: There is evidence of prior sinonasal surgery with significant  paranasal sinus opacification. Orbits are unremarkable. Other: None. IMPRESSION: No acute intracranial hemorrhage, mass effect, or evidence of acute infarction. Electronically Signed   By: Macy Mis M.D.   On: 10/31/2019 16:23   CT Angio Neck W and/or Wo Contrast  Result Date: 10/31/2019 CLINICAL DATA:  Transient ischemic attack (TIA). Additional history provided: Patient reports right eye "dropping," double vision, lightheadedness, fatigue. EXAM: CT ANGIOGRAPHY HEAD AND NECK TECHNIQUE: Multidetector CT imaging of the head and neck was performed using the standard protocol during bolus administration of intravenous contrast. Multiplanar CT image reconstructions and MIPs were obtained to evaluate the  vascular anatomy. Carotid stenosis measurements (when applicable) are obtained utilizing NASCET criteria, using the distal internal carotid diameter as the denominator. CONTRAST:  44mL OMNIPAQUE IOHEXOL 350 MG/ML SOLN COMPARISON:  Noncontrast head CT performed earlier the same day 10/31/2019 FINDINGS: CTA NECK FINDINGS Aortic arch: Standard aortic branching. The visualized aortic arch is unremarkable. No significant innominate or proximal subclavian artery stenosis. Right carotid system: CCA and ICA patent within the neck without stenosis. Left carotid system: CCA and ICA patent within the neck without stenosis. Minimal calcified plaque at the left carotid bifurcation. Vertebral arteries: The left vertebral artery is subtly dominant. The vertebral arteries are patent within the neck without stenosis. Skeleton: Cervical spondylosis with shallow multilevel posterior disc osteophytes and uncovertebral spurring. Other neck: No neck mass or cervical lymphadenopathy. Upper chest: No consolidation within the imaged lung apices. Review of the MIP images confirms the above findings CTA HEAD FINDINGS Anterior circulation: The intracranial internal carotid arteries are patent without stenosis. Minimal calcified plaque within these vessels. The M1 middle cerebral arteries are patent without significant stenosis. No M2 proximal branch occlusion or high-grade proximal stenosis is identified. The anterior cerebral arteries are patent without significant proximal stenosis. No intracranial aneurysm is identified. Posterior circulation: The intracranial vertebral arteries are patent without significant stenosis. The basilar artery is developmentally diminutive but patent. Fetal origin posterior cerebral arteries bilaterally which are patent without significant proximal stenosis. Venous sinuses: Within limitations of contrast timing, no convincing thrombus. Anatomic variants: As described Review of the MIP images confirms the above  findings IMPRESSION: CTA neck: The bilateral common carotid, internal carotid and vertebral arteries are patent within the neck without significant stenosis. Minimal calcified plaque at the left carotid bifurcation. CTA head: 1. No intracranial large vessel occlusion or high-grade proximal arterial stenosis. 2. 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 Electronically Signed   By: Kellie Simmering DO   On: 10/31/2019 17:41    Procedures Procedures (including critical care time)  Medications Ordered in ED Medications - No data to display  ED Course  I have reviewed the triage vital signs and the nursing notes.  Pertinent labs & imaging results that were available during my care of the patient were reviewed by me and considered in my medical decision making (see chart for details).  Clinical Course as of Oct 31 1915  Mon Oct 31, 2019  1355 Differential includes TIA, intraocular problem, stroke, bleed   [MB]  1359 Patient's lab work so far has been fairly unremarkable.  I consulted neurology Dr. Malen Gauze who recommended getting an MRI.  He said if the MRI is positive he will need to be admitted for further work-up.  If negative his score is fairly low and can have an outpatient work-up.   [MB]  1441 Radiology said they would not do an MRI of the patient because he  has a history of working Pharmacist, community dust inhalation   [MB]  1621 CT interpreted by me as no gross bleeding or stroke noted.  Awaiting radiology reading.   [MB]  1637 Discussed with Dr. Malen Gauze from neurology.  He recommended getting a CTA head and neck and if this is negative can have an outpatient work-up.   [MB]    Clinical Course User Index [MB] Hayden Rasmussen, MD   MDM Rules/Calculators/A&P                       Final Clinical Impression(s) / ED Diagnoses Final diagnoses:  Transient diplopia    Rx / DC Orders ED Discharge Orders           Ordered    Ambulatory referral to Neurology    Comments: An appointment is requested in approximately: 2 weeks   10/31/19 1642           Hayden Rasmussen, MD 10/31/19 [phone]

## 2019-10-31 NOTE — ED Notes (Signed)
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 Melina Copa MD made aware.

## 2019-10-31 NOTE — ED Notes (Signed)
Pt verbalizes understanding of DC instructions. Pt belongings returned and is ambulatory out of ED.  

## 2019-12-26 ENCOUNTER — Other Ambulatory Visit: Payer: Self-pay

## 2019-12-26 ENCOUNTER — Emergency Department (HOSPITAL_COMMUNITY): Payer: Medicare Other

## 2019-12-26 ENCOUNTER — Encounter (HOSPITAL_COMMUNITY): Payer: Self-pay | Admitting: Emergency Medicine

## 2019-12-26 ENCOUNTER — Emergency Department (HOSPITAL_COMMUNITY)
Admission: EM | Admit: 2019-12-26 | Discharge: 2019-12-26 | Disposition: A | Discharge status: Home / Self Care | Payer: Medicare Other | Attending: Emergency Medicine | Admitting: Emergency Medicine

## 2019-12-26 DIAGNOSIS — K8020 Calculus of gallbladder without cholecystitis without obstruction: Secondary | ICD-10-CM

## 2019-12-26 DIAGNOSIS — N200 Calculus of kidney: Secondary | ICD-10-CM

## 2019-12-26 DIAGNOSIS — Z79899 Other long term (current) drug therapy: Secondary | ICD-10-CM | POA: Insufficient documentation

## 2019-12-26 DIAGNOSIS — R109 Unspecified abdominal pain: Secondary | ICD-10-CM

## 2019-12-26 DIAGNOSIS — Z7982 Long term (current) use of aspirin: Secondary | ICD-10-CM | POA: Diagnosis not present

## 2019-12-26 DIAGNOSIS — Z91018 Allergy to other foods: Secondary | ICD-10-CM | POA: Diagnosis not present

## 2019-12-26 LAB — URINALYSIS, ROUTINE W REFLEX MICROSCOPIC
Bilirubin Urine: NEGATIVE
Glucose, UA: NEGATIVE mg/dL
Hgb urine dipstick: NEGATIVE
Ketones, ur: NEGATIVE mg/dL
Leukocytes,Ua: NEGATIVE
Nitrite: NEGATIVE
Protein, ur: NEGATIVE mg/dL
Specific Gravity, Urine: 1.02 (ref 1.005–1.030)
pH: 8 (ref 5.0–8.0)

## 2019-12-26 MED ORDER — ONDANSETRON 4 MG PO TBDP
4.0000 mg | ORAL_TABLET | Freq: Once | ORAL | Status: AC
  Administered 2019-12-26: 4 mg via ORAL
  Filled 2019-12-26: qty 1

## 2019-12-26 MED ORDER — KETOROLAC TROMETHAMINE 60 MG/2ML IM SOLN
30.0000 mg | Freq: Once | INTRAMUSCULAR | Status: AC
  Administered 2019-12-26: 30 mg via INTRAMUSCULAR
  Filled 2019-12-26: qty 2

## 2019-12-26 MED ORDER — OXYCODONE-ACETAMINOPHEN 5-325 MG PO TABS
1.0000 | ORAL_TABLET | Freq: Once | ORAL | Status: AC
  Administered 2019-12-26: 1 via ORAL
  Filled 2019-12-26: qty 1

## 2019-12-26 NOTE — ED Provider Notes (Signed)
[facility] DEPT Provider Note   CSN: [id number] Arrival date & time: 12/26/19  1054     History Chief Complaint  Patient presents with  . Flank Pain    Jose Hartman is a 59 y.o. male with a history of reflux and kidney stones presenting to ED with acute onset right-sided flank pain.  Reports pain woke him up from sleep around 2 AM this morning.  He describes a stabbing pain in his right lower flank which radiates around towards his groin.  He says it feels just like his prior kidney stone.  Nothing was making the pain better.  Now the pain comes and goes.  Currently it is a 9 out of 10.  He describes nausea this morning due to pain.  He vomited after trying to eat.  He denies fevers or chills.  He denies dysuria.  He denies diarrhea.  Denies any history of abdominal aneurysm or smoking history.  He does use chewing tobacco.   HPI     Past Medical History:  Diagnosis Date  . Arthritis    left arm   . GERD (gastroesophageal reflux disease)   . History of kidney stones   . S/P balloon dilatation of esophageal stricture     Patient Active Problem List   Diagnosis Date Noted  . Left nasal polyps 02/04/2016  . Left LBP 01/16/2016  . 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 Recurrent kidney stones 12/11/2015  . Asthma 12/11/2015  . Sinusitis, chronic 12/11/2015    Past Surgical History:  Procedure Laterality Date  . BALLOON DILATION N/A 08/13/2018   Procedure: BALLOON DILATION;  Surgeon: Ronnette Juniper, MD;  Location: [facility];  Service: Gastroenterology;  Laterality: N/A;  . COLONOSCOPY N/A 08/13/2018   Procedure: COLONOSCOPY;  Surgeon: Ronnette Juniper, MD;  Location: [facility];  Service: Gastroenterology;  Laterality: N/A;  . CYSTOSCOPY/RETROGRADE/URETEROSCOPY/STONE EXTRACTION WITH BASKET Left 09/09/2013   Procedure: CYSTOSCOPY/LEFT RETROGRADE PYELOGRAM /FLEXIBLE URETEROSCOPY/INSERTION LEFT URETERAL STENT;  Surgeon: Ardis Hughs, MD;  Location: [facility];  Service: Urology;  Laterality: Left;  . ESOPHAGOGASTRODUODENOSCOPY Left 05/15/2017   Procedure: ESOPHAGOGASTRODUODENOSCOPY (EGD);  Surgeon: Ronnette Juniper, MD;  Location: [facility];  Service: Gastroenterology;  Laterality: Left;  . ESOPHAGOGASTRODUODENOSCOPY N/A 08/13/2018   Procedure: ESOPHAGOGASTRODUODENOSCOPY (EGD);  Surgeon: Ronnette Juniper, MD;  Location: [facility];  Service: Gastroenterology;  Laterality: N/A;  . EXTRACORPOREAL SHOCK WAVE LITHOTRIPSY    . FRACTURE SURGERY     left wrist  . POLYPECTOMY  08/13/2018   Procedure: POLYPECTOMY;  Surgeon: Ronnette Juniper, MD;  Location: [facility];  Service: Gastroenterology;;  . SEPTOPLASTY WITH ETHMOIDECTOMY, AND MAXILLARY ANTROSTOMY Bilateral 09/14/2018   Procedure: SEPTOPLASTY WITH ETHMOIDECTOMY, AND MAXILLARY ANTROSTOMY,SPHENOIDECTOMY AND TISSUE REMOVAL AND FRONTAL RECESS Pilar Grammes;  Surgeon: Leta Baptist, MD;  Location: [facility];  Service: ENT;  Laterality: Bilateral;  . SINUS ENDO WITH FUSION Bilateral 09/14/2018   Procedure: SINUS ENDO WITH FUSION;  Surgeon: Leta Baptist, MD;  Location: [facility];  Service: ENT;  Laterality: Bilateral;  . Uretheral Stent placement         Family History  Problem Relation Age of Onset  . Heart disease Other   . Diabetes Mellitus II Other   . Esophageal cancer Brother     Social History   Tobacco Use  . Smoking status: Never Smoker  . Smokeless tobacco: Current User    Types: Chew  Substance Use Topics  . Alcohol use: Yes    Comment: rare  beer  . Drug use: No    Home Medications Prior to Admission medications   Medication Sig Start Date End Date Taking? Authorizing Provider  Alum & Mag Hydroxide-Simeth (MYLANTA PO) Take 1 Dose by mouth 2 (two) times daily. Liquid   Yes [provider]  aspirin EC 81 MG tablet Take 81 mg by mouth daily.   Yes [provider]  pantoprazole (PROTONIX) 40 MG tablet Take 40 mg by mouth 2 (two) times daily. 11/23/19  Yes  [provider]  sucralfate (CARAFATE) 1 GM/10ML suspension Take 1 g by mouth 2 (two) times daily. 11/23/19  Yes [provider]  oxyCODONE-acetaminophen (PERCOCET) 5-325 MG tablet Take 1 tablet by mouth every 4 (four) hours as needed for severe pain. Patient not taking: Reported on 10/31/2019 09/14/18   Leta Baptist, MD    Allergies    Benadryl [diphenhydramine hcl] and Peanuts [peanut oil]  Review of Systems   Review of Systems  Constitutional: Negative for chills and fever.  Respiratory: Negative for cough and shortness of breath.   Cardiovascular: Negative for chest pain and palpitations.  Gastrointestinal: Positive for vomiting. Negative for abdominal pain.  Genitourinary: Positive for flank pain. Negative for dysuria, hematuria, scrotal swelling and testicular pain.  Musculoskeletal: Positive for back pain. Negative for arthralgias.  Skin: Negative for rash and wound.  Neurological: Negative for syncope and headaches.  Psychiatric/Behavioral: Negative for agitation and confusion.  All other systems reviewed and are negative.   Physical Exam Updated Vital Signs BP 138/69   Pulse 64   Temp 97.8 F (36.6 C) (Oral)   Resp 15   SpO2 96%   Physical Exam Vitals and nursing note reviewed.  Constitutional:      Appearance: He is well-developed.  HENT:     Head: Normocephalic and atraumatic.  Eyes:     Conjunctiva/sclera: Conjunctivae normal.  Cardiovascular:     Rate and Rhythm: Normal rate and regular rhythm.     Pulses: Normal pulses.  Pulmonary:     Effort: Pulmonary effort is normal. No respiratory distress.     Breath sounds: Normal breath sounds.  Abdominal:     General: There is no distension.     Palpations: Abdomen is soft. There is no mass.     Tenderness: There is no abdominal tenderness. There is no right CVA tenderness, left CVA tenderness or guarding.  Musculoskeletal:     Cervical back: Neck supple.  Skin:    General: Skin is warm and dry.    Neurological:     General: No focal deficit present.     Mental Status: He is alert and oriented to person, place, and time.  Psychiatric:        Mood and Affect: Mood normal.        Behavior: Behavior normal.     ED Results / Procedures / Treatments   Labs (all labs ordered are listed, but only abnormal results are displayed) Labs Reviewed  URINALYSIS, ROUTINE W REFLEX MICROSCOPIC - Abnormal; Notable for the following components:      Result Value   APPearance CLOUDY (*)    All other components within normal limits    EKG None  Radiology CT Renal Stone Study  Result Date: 12/26/2019 CLINICAL DATA:  Right flank pain, history of urolithiasis EXAM: CT ABDOMEN AND PELVIS WITHOUT CONTRAST TECHNIQUE: Multidetector CT imaging of the abdomen and pelvis was performed following the standard protocol without IV contrast. COMPARISON:  CT 08/23/2013 FINDINGS: Lower chest: Atelectatic changes  in the lung bases. Lung bases are otherwise clear. Normal heart size. No pericardial effusion. Hepatobiliary: No visible focal liver lesions on this noncontrast CT exam. Smooth liver surface contour. Normal hepatic attenuation. The gallbladder contains a tiny dependently layering gallstone as well as a larger 2.1 cm partially calcified gallstone towards the biliary neck. No pericholecystic fluid or inflammation. No biliary ductal dilatation or calcified intraductal gallstones. Pancreas: Unremarkable. No pancreatic ductal dilatation or surrounding inflammatory changes. Spleen: Normal in size without focal abnormality. Adrenals/Urinary Tract: Normal adrenal glands there is asymmetric mild right hydroureteronephrosis of without visible obstructing calculus in the renal pelvis or along the course of the ureter. No visible calculus in the urinary bladder or along the expected course of the urethra either. Tiny nonobstructing calculi are seen in the upper pole right kidney and inferior pole left kidney without  significant left urinary tract dilatation. No worrisome renal masses. Indentation of the bladder base by the enlarged prostate. Urinary bladder otherwise unremarkable. Stomach/Bowel: Distal esophagus, stomach and duodenal sweep are unremarkable. No small bowel wall thickening or dilatation. No evidence of obstruction. A normal appendix is visualized. No colonic dilatation or wall thickening. Vascular/Lymphatic: Atherosclerotic calcifications throughout the abdominal aorta and branch vessels. No aneurysm or ectasia. No enlarged abdominopelvic lymph nodes. Focal region of mid mesenteric hazy stranding with several reactive appearing clustered mid mesenteric lymph nodes (2/37). Finding likely reflects sequela of remote or chronic mesenteritis given the presence of this finding on comparison imaging as well. Reproductive: Mild prostatomegaly with a coarse benign eccentric calcification. Other: No abdominopelvic free fluid or free gas. No bowel containing hernias. Musculoskeletal: Multilevel degenerative changes are present in the imaged portions of the spine. No acute osseous abnormality or suspicious osseous lesion. IMPRESSION: 1. Asymmetric mild right hydroureteronephrosis of without visible obstructing calculus in the renal pelvis or along the course of the ureter. No visible calculus in the urinary bladder or along the expected course of the urethra either. Findings could reflect a recently passed stone versus less likely ascending urinary tract infection. Correlate with urinalysis. 2. Additional tiny nonobstructing calculi in the upper pole right kidney and inferior pole left kidney. 3. Focal region of central mesenteric stranding with reactive nodes may reflect sequela of remote or chronic mesenteritis, unchanged from prior. 4. Cholelithiasis without evidence of acute cholecystitis. 5. Mild prostatomegaly with a coarse benign eccentric calcification. Slight mass effect on the bladder. Correlate for outlet  obstruction symptoms. 6. Aortic Atherosclerosis (ICD10-I70.0). Electronically Signed   By: Lovena Le M.D.   On: 12/26/2019 17:48    Procedures Procedures (including critical care time)  Medications Ordered in ED Medications  oxyCODONE-acetaminophen (PERCOCET/ROXICET) 5-325 MG per tablet 1 tablet (1 tablet Oral Given 12/26/19 1615)  ondansetron (ZOFRAN-ODT) disintegrating tablet 4 mg (4 mg Oral Given 12/26/19 1616)  ketorolac (TORADOL) injection 30 mg (30 mg Intramuscular Given 12/26/19 1616)    ED Course  I have reviewed the triage vital signs and the nursing notes.  Pertinent labs & imaging results that were available during my care of the patient were reviewed by me and considered in my medical decision making (see chart for details).  59 yo male here with acute onset right sided flank pain, hx of kidney stones, feels the same to him.    Well appearing on exam, occasionally grimacing with colicy pain.  No vomiting in the ED.  Vitals unremarkable, afebrile.  Doubtful of pyelonephritis or sepsis.  Doubtful of AAA or acute intraabdominal surgical emergency at this time.  He  reports needing lithotripsy in the past, we'll get a CT renal study, and check a UA here.  Will try PO percocet, zofran, IM toradol for pain Don't think we need blood tests with this initial presentation, we'll reassess after his scan  Clinical Course as of Dec 27 950  Mon Dec 26, 2019  [place] Patient is completely asymptomatic.  I reviewed the results of his CT scan.  He does have gallstones he was unaware of and I told him about this. He has absolutely no tenderness in the right upper quadrant to suggest this is an active issue for him.  I suspect he likely passed his renal stone.  We can discharge him.   [MT]    Clinical Course User Index [MT] Avabella Wailes, Carola Rhine, MD    Final Clinical Impression(s) / ED Diagnoses Final diagnoses:  Flank pain  Kidney stone  Gallstones    Rx / DC Orders ED Discharge Orders     None       Wyvonnia Dusky, MD 12/27/19 [phone]

## 2019-12-26 NOTE — Discharge Instructions (Signed)
You very likely passed a kidney stone today.  Your CT scan did not show that you are still passing a kidney stone.  It may have popped up before your scan.  You were pain-free when I went back into the room.  I felt it was reasonable to discharge you.  I also explained to you that you had gallstones that we found incidentally on your scan.  Included information for you to read over.  I do not think this is an active issue today, but please read through this information and be aware of it.

## 2019-12-26 NOTE — ED Triage Notes (Signed)
Pt c/o severe right flank pains that started earlier this morning. Reports hx kidney stones. Reports urinating fine without any pain, issues, or blood.

## 2020-01-04 ENCOUNTER — Encounter (HOSPITAL_BASED_OUTPATIENT_CLINIC_OR_DEPARTMENT_OTHER): Payer: Self-pay | Admitting: Emergency Medicine

## 2020-01-04 ENCOUNTER — Emergency Department (HOSPITAL_BASED_OUTPATIENT_CLINIC_OR_DEPARTMENT_OTHER)
Admission: EM | Admit: 2020-01-04 | Discharge: 2020-01-04 | Disposition: A | Discharge status: Home / Self Care | Payer: Medicare Other | Attending: Emergency Medicine | Admitting: Emergency Medicine

## 2020-01-04 ENCOUNTER — Other Ambulatory Visit: Payer: Self-pay

## 2020-01-04 DIAGNOSIS — Z7982 Long term (current) use of aspirin: Secondary | ICD-10-CM | POA: Insufficient documentation

## 2020-01-04 DIAGNOSIS — H53149 Visual discomfort, unspecified: Secondary | ICD-10-CM | POA: Diagnosis not present

## 2020-01-04 DIAGNOSIS — F17220 Nicotine dependence, chewing tobacco, uncomplicated: Secondary | ICD-10-CM | POA: Insufficient documentation

## 2020-01-04 DIAGNOSIS — Z79899 Other long term (current) drug therapy: Secondary | ICD-10-CM | POA: Insufficient documentation

## 2020-01-04 DIAGNOSIS — R102 Pelvic and perineal pain: Secondary | ICD-10-CM | POA: Diagnosis not present

## 2020-01-04 DIAGNOSIS — R300 Dysuria: Secondary | ICD-10-CM | POA: Insufficient documentation

## 2020-01-04 DIAGNOSIS — R339 Retention of urine, unspecified: Secondary | ICD-10-CM | POA: Diagnosis present

## 2020-01-04 DIAGNOSIS — E860 Dehydration: Secondary | ICD-10-CM | POA: Diagnosis not present

## 2020-01-04 DIAGNOSIS — Z91010 Allergy to peanuts: Secondary | ICD-10-CM | POA: Insufficient documentation

## 2020-01-04 DIAGNOSIS — R1110 Vomiting, unspecified: Secondary | ICD-10-CM | POA: Insufficient documentation

## 2020-01-04 LAB — URINALYSIS, ROUTINE W REFLEX MICROSCOPIC
Bilirubin Urine: NEGATIVE
Glucose, UA: NEGATIVE mg/dL
Ketones, ur: NEGATIVE mg/dL
Leukocytes,Ua: NEGATIVE
Nitrite: NEGATIVE
Protein, ur: NEGATIVE mg/dL
Specific Gravity, Urine: >1.03 — ABNORMAL HIGH (ref 1.005–1.030)
pH: 5.5 (ref 5.0–8.0)

## 2020-01-04 LAB — COMPREHENSIVE METABOLIC PANEL
ALT: 26 U/L (ref 0–44)
AST: 27 U/L (ref 15–41)
Albumin: 3.7 g/dL (ref 3.5–5.0)
Alkaline Phosphatase: 67 U/L (ref 38–126)
Anion gap: 10 (ref 5–15)
BUN: 23 mg/dL — ABNORMAL HIGH (ref 6–20)
CO2: 24 mmol/L (ref 22–32)
Calcium: 8.7 mg/dL — ABNORMAL LOW (ref 8.9–10.3)
Chloride: 105 mmol/L (ref 98–111)
Creatinine, Ser: 1.01 mg/dL (ref 0.61–1.24)
GFR calc Af Amer: >60 mL/min (ref >60)
GFR calc non Af Amer: >60 mL/min (ref >60)
Glucose, Bld: 112 mg/dL — ABNORMAL HIGH (ref 70–99)
Potassium: 3.6 mmol/L (ref 3.5–5.1)
Sodium: 139 mmol/L (ref 135–145)
Total Bilirubin: 0.7 mg/dL (ref 0.3–1.2)
Total Protein: 6.9 g/dL (ref 6.5–8.1)

## 2020-01-04 LAB — CBC WITH DIFFERENTIAL/PLATELET
Abs Immature Granulocytes: 0.02 10*3/uL (ref 0.00–0.07)
Basophils Absolute: 0.1 10*3/uL (ref 0.0–0.1)
Basophils Relative: 1 %
Eosinophils Absolute: 0.5 10*3/uL (ref 0.0–0.5)
Eosinophils Relative: 8 %
HCT: 43.7 % (ref 39.0–52.0)
Hemoglobin: 14.9 g/dL (ref 13.0–17.0)
Immature Granulocytes: 0 %
Lymphocytes Relative: 25 %
Lymphs Abs: 1.6 10*3/uL (ref 0.7–4.0)
MCH: 30.9 pg (ref 26.0–34.0)
MCHC: 34.1 g/dL (ref 30.0–36.0)
MCV: 90.7 fL (ref 80.0–100.0)
Monocytes Absolute: 0.7 10*3/uL (ref 0.1–1.0)
Monocytes Relative: 11 %
Neutro Abs: 3.6 10*3/uL (ref 1.7–7.7)
Neutrophils Relative %: 55 %
Platelets: 275 10*3/uL (ref 150–400)
RBC: 4.82 MIL/uL (ref 4.22–5.81)
RDW: 13 % (ref 11.5–15.5)
WBC: 6.5 10*3/uL (ref 4.0–10.5)
nRBC: 0 % (ref 0.0–0.2)

## 2020-01-04 LAB — URINALYSIS, MICROSCOPIC (REFLEX)

## 2020-01-04 NOTE — ED Provider Notes (Signed)
[facility] EMERGENCY DEPARTMENT Provider Note   CSN: [id number] Arrival date & time: 01/04/20  2033     History Chief Complaint  Patient presents with  . Urinary Retention    Jose Hartman is a 59 y.o. male.  Pt is a 59y/o male with hx of kidney stones, GERD and arthritis who presents today due to decreased urination for the last 2 days.  Pt reports he passed a kidney stone 10 days ago with  CT done at [facility] that showed right hydroureteronephrosis.  He reports the pain resolved and he did not go home on anything.  Pt states yesterday the amount he urinated decreased and it was green and mild dysuria.  Patient reports today the last time he urinated was around 3 PM and he has been drinking multiple bottles of water, tea and eating but has not had any further urination.  He does not have the urge to urinate or feel like he is retaining urine.  He has no flank pain and reports only a very small amount of pain over his bladder.  He did have one episode of vomiting yesterday but is otherwise felt fine today.  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  No recent medication changes.  The history is provided by the patient.       Past Medical History:  Diagnosis Date  . Arthritis    left arm   . GERD (gastroesophageal reflux disease)   . History of kidney stones   . S/P balloon dilatation of esophageal stricture     Patient Active Problem List   Diagnosis Date Noted  . Left nasal polyps 02/04/2016  . Left LBP 01/16/2016  . 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 Recurrent kidney stones 12/11/2015  . Asthma 12/11/2015  . Sinusitis, chronic 12/11/2015    Past Surgical History:  Procedure Laterality Date  . BALLOON DILATION N/A 08/13/2018   Procedure: BALLOON DILATION;  Surgeon: Ronnette Juniper, MD;  Location: [facility];  Service: Gastroenterology;   Laterality: N/A;  . COLONOSCOPY N/A 08/13/2018   Procedure: COLONOSCOPY;  Surgeon: Ronnette Juniper, MD;  Location: [facility];  Service: Gastroenterology;  Laterality: N/A;  . CYSTOSCOPY/RETROGRADE/URETEROSCOPY/STONE EXTRACTION WITH BASKET Left 09/09/2013   Procedure: CYSTOSCOPY/LEFT RETROGRADE PYELOGRAM /FLEXIBLE URETEROSCOPY/INSERTION LEFT URETERAL STENT;  Surgeon: Ardis Hughs, MD;  Location: [facility];  Service: Urology;  Laterality: Left;  . ESOPHAGOGASTRODUODENOSCOPY Left 05/15/2017   Procedure: ESOPHAGOGASTRODUODENOSCOPY (EGD);  Surgeon: Ronnette Juniper, MD;  Location: [facility];  Service: Gastroenterology;  Laterality: Left;  . ESOPHAGOGASTRODUODENOSCOPY N/A 08/13/2018   Procedure: ESOPHAGOGASTRODUODENOSCOPY (EGD);  Surgeon: Ronnette Juniper, MD;  Location: [facility];  Service: Gastroenterology;  Laterality: N/A;  . EXTRACORPOREAL SHOCK WAVE LITHOTRIPSY    . FRACTURE SURGERY     left wrist  . POLYPECTOMY  08/13/2018   Procedure: POLYPECTOMY;  Surgeon: Ronnette Juniper, MD;  Location: [facility];  Service: Gastroenterology;;  . SEPTOPLASTY WITH ETHMOIDECTOMY, AND MAXILLARY ANTROSTOMY Bilateral 09/14/2018   Procedure: SEPTOPLASTY WITH ETHMOIDECTOMY, AND MAXILLARY ANTROSTOMY,SPHENOIDECTOMY AND TISSUE REMOVAL AND FRONTAL RECESS Pilar Grammes;  Surgeon: Leta Baptist, MD;  Location: [facility];  Service: ENT;  Laterality: Bilateral;  . SINUS ENDO WITH FUSION Bilateral 09/14/2018   Procedure: SINUS ENDO WITH FUSION;  Surgeon: Leta Baptist, MD;  Location: [facility];  Service: ENT;  Laterality: Bilateral;  .  Uretheral Stent placement         Family History  Problem Relation Age of Onset  . Heart disease Other   . Diabetes Mellitus II Other   . Esophageal cancer Brother     Social History   Tobacco Use  . Smoking status: Never Smoker  . Smokeless tobacco: Current User    Types: Chew  Substance Use Topics  . Alcohol use: Yes    Comment: rare beer  . Drug use: No    Home  Medications Prior to Admission medications   Medication Sig Start Date End Date Taking? Authorizing Provider  Alum & Mag Hydroxide-Simeth (MYLANTA PO) Take 1 Dose by mouth 2 (two) times daily. Liquid    [provider]  aspirin EC 81 MG tablet Take 81 mg by mouth daily.    [provider]  oxyCODONE-acetaminophen (PERCOCET) 5-325 MG tablet Take 1 tablet by mouth every 4 (four) hours as needed for severe pain. Patient not taking: Reported on 10/31/2019 09/14/18   Leta Baptist, MD  pantoprazole (PROTONIX) 40 MG tablet Take 40 mg by mouth 2 (two) times daily. 11/23/19   [provider]  sucralfate (CARAFATE) 1 GM/10ML suspension Take 1 g by mouth 2 (two) times daily. 11/23/19   [provider]    Allergies    Benadryl [diphenhydramine hcl] and Peanuts [peanut oil]  Review of Systems   Review of Systems  All other systems reviewed and are negative.   Physical Exam Updated Vital Signs BP (!) 148/85 (BP Location: Right Arm)   Pulse 98   Temp 98.9 F (37.2 C) (Oral)   Resp 16   SpO2 100%   Physical Exam Vitals and nursing note reviewed.  Constitutional:      General: He is not in acute distress.    Appearance: Normal appearance. He is well-developed and normal weight.  HENT:     Head: Normocephalic and atraumatic.     Mouth/Throat:     Mouth: Mucous membranes are moist.  Eyes:     General:        Right eye: No discharge.        Left eye: No discharge.     Conjunctiva/sclera: Conjunctivae normal.     Pupils: Pupils are equal, round, and reactive to light.     Comments: photophobia  Neck:     Meningeal: Brudzinski's sign and Kernig's sign absent.  Cardiovascular:     Rate and Rhythm: Normal rate.     Heart sounds: Normal heart sounds. No murmur.  Pulmonary:     Effort: Pulmonary effort is normal. No respiratory distress.     Breath sounds: Normal breath sounds. No wheezing or rales.  Abdominal:     General: There is no distension.      Palpations: Abdomen is soft.     Tenderness: There is abdominal tenderness in the suprapubic area. There is no right CVA tenderness or left CVA tenderness.     Comments: Minimal suprapubic tenderness  Genitourinary:    Penis: Normal.      Testes: Normal.  Musculoskeletal:        General: No tenderness. Normal range of motion.     Cervical back: Normal range of motion and neck supple. No rigidity. No spinous process tenderness.     Right lower leg: No edema.     Left lower leg: No edema.  Lymphadenopathy:     Cervical: No cervical adenopathy.  Skin:    General: Skin is warm and dry.  Neurological:     General: No focal deficit present.     Mental Status: He is alert and oriented to person, place, and time.     GCS: GCS eye subscore is 4. GCS verbal subscore is 5. GCS motor subscore is 6.     Cranial Nerves: No cranial nerve deficit.     Sensory: No sensory deficit.     Coordination: Coordination normal.     Gait: Gait normal.  Psychiatric:        Mood and Affect: Mood normal.        Behavior: Behavior normal.        Thought Content: Thought content normal.     ED Results / Procedures / Treatments   Labs (all labs ordered are listed, but only abnormal results are displayed) Labs Reviewed  COMPREHENSIVE METABOLIC PANEL - Abnormal; Notable for the following components:      Result Value   Glucose, Bld 112 (*)    BUN 23 (*)    Calcium 8.7 (*)    All other components within normal limits  URINALYSIS, ROUTINE W REFLEX MICROSCOPIC - Abnormal; Notable for the following components:   Specific Gravity, Urine >1.030 (*)    Hgb urine dipstick TRACE (*)    All other components within normal limits  URINALYSIS, MICROSCOPIC (REFLEX) - Abnormal; Notable for the following components:   Bacteria, UA FEW (*)    All other components within normal limits  CBC WITH DIFFERENTIAL/PLATELET    EKG None  Radiology No results found.  Procedures Procedures (including critical care  time)  Medications Ordered in ED Medications - No data to display  ED Course  I have reviewed the triage vital signs and the nursing notes.  Pertinent labs & imaging results that were available during my care of the patient were reviewed by me and considered in my medical decision making (see chart for details).    MDM Rules/Calculators/A&P                      Patient presenting today with decreased urine output.  He has no symptoms consistent with urinary retention.  Bedside ultrasound shows 78 mL in the bladder.  He is having no flank pain or symptoms consistent with pyelonephritis.  He is having some dysuria which could be UTI versus also could have dehydration given he has been outside and had decreased urination.  Will ensure normal renal function.  CT done 10 days ago showed a recently passed stone due to hydroureteronephrosis and patient reports the pain spontaneously went away while waiting.  Will check labs to ensure normal renal function and UA.  Patient is well-appearing at this time.  10:48 PM Patient CBC is within normal limits today, CMP without acute change and no significant findings for acute kidney injury.  Patient's urine with high specific gravity and trace hemoglobin but otherwise no significant findings for infection.  Suspect that patient is just dehydrated for working outside increased sweating and not drinking enough fluid.  He had 150 mL out by straight cath but still has otherwise no symptoms.  Encouraged increased fluid intake and return for development of any additional symptoms.  MDM Number of Diagnoses or Management Options   Amount and/or Complexity of Data Reviewed Clinical lab tests: ordered and reviewed Independent visualization of images, tracings, or specimens: yes  Risk of Complications, Morbidity, and/or Mortality Presenting problems: low Diagnostic procedures: minimal Management options: minimal  Patient Progress Patient progress:  stable  Final Clinical Impression(s) / ED Diagnoses Final diagnoses:  Dehydration    Rx / DC Orders ED Discharge Orders    None       Blanchie Dessert, MD 01/04/20 2249

## 2020-01-04 NOTE — ED Triage Notes (Signed)
States had kidney stone on Monday, now cant urinate. Pt states his urine is green. Last void was 1500 today. Alert, amb. NAD

## 2020-01-04 NOTE — Discharge Instructions (Signed)
Kidney function and urine were okay today.  He was just dehydrated and need to drink more water.  If you start having fever, persistent vomiting or severe pain in your lower back or stomach you should return for repeat evaluation.

## 2020-04-14 ENCOUNTER — Emergency Department (HOSPITAL_BASED_OUTPATIENT_CLINIC_OR_DEPARTMENT_OTHER)
Admission: EM | Admit: 2020-04-14 | Discharge: 2020-04-14 | Disposition: A | Discharge status: Home / Self Care | Payer: Medicare Other | Attending: Emergency Medicine | Admitting: Emergency Medicine

## 2020-04-14 ENCOUNTER — Other Ambulatory Visit: Payer: Self-pay

## 2020-04-14 ENCOUNTER — Encounter (HOSPITAL_BASED_OUTPATIENT_CLINIC_OR_DEPARTMENT_OTHER): Payer: Self-pay

## 2020-04-14 DIAGNOSIS — Z5321 Procedure and treatment not carried out due to patient leaving prior to being seen by health care provider: Secondary | ICD-10-CM | POA: Insufficient documentation

## 2020-04-14 DIAGNOSIS — R0602 Shortness of breath: Secondary | ICD-10-CM | POA: Diagnosis not present

## 2020-04-14 DIAGNOSIS — R093 Abnormal sputum: Secondary | ICD-10-CM | POA: Diagnosis not present

## 2020-04-14 DIAGNOSIS — Z20822 Contact with and (suspected) exposure to covid-19: Secondary | ICD-10-CM | POA: Diagnosis not present

## 2020-04-14 DIAGNOSIS — J45909 Unspecified asthma, uncomplicated: Secondary | ICD-10-CM | POA: Insufficient documentation

## 2020-04-14 DIAGNOSIS — R0981 Nasal congestion: Secondary | ICD-10-CM | POA: Insufficient documentation

## 2020-04-14 LAB — SARS CORONAVIRUS 2 BY RT PCR (HOSPITAL ORDER, PERFORMED IN ~~LOC~~ HOSPITAL LAB): SARS Coronavirus 2: NEGATIVE

## 2020-04-14 NOTE — ED Triage Notes (Signed)
Pt arrives with c/o nasal congestion and green sputum X1 week. Pt reports he is asthmatic and has inhalers that he is using at home, reports chronic SOB, and denies any worsening SOB. Pt also cannot smell.

## 2020-10-04 ENCOUNTER — Other Ambulatory Visit: Payer: Self-pay | Admitting: Urology

## 2020-10-11 ENCOUNTER — Other Ambulatory Visit: Payer: Self-pay

## 2020-10-11 ENCOUNTER — Encounter: Payer: Self-pay | Admitting: Cardiology

## 2020-10-11 ENCOUNTER — Ambulatory Visit: Payer: Medicare Other | Admitting: Cardiology

## 2020-10-11 VITALS — BP 110/77 | HR 71 | Temp 98.5°F | Resp 17 | Ht 67.0 in | Wt 159.0 lb

## 2020-10-11 DIAGNOSIS — E7800 Pure hypercholesterolemia, unspecified: Secondary | ICD-10-CM

## 2020-10-11 DIAGNOSIS — Z8673 Personal history of transient ischemic attack (TIA), and cerebral infarction without residual deficits: Secondary | ICD-10-CM

## 2020-10-11 DIAGNOSIS — Z01810 Encounter for preprocedural cardiovascular examination: Secondary | ICD-10-CM

## 2020-10-11 DIAGNOSIS — R9431 Abnormal electrocardiogram [ECG] [EKG]: Secondary | ICD-10-CM

## 2020-10-11 DIAGNOSIS — Z720 Tobacco use: Secondary | ICD-10-CM

## 2020-10-11 NOTE — Progress Notes (Signed)
Primary Physician/Referring:  Pieter Partridge, PA  Patient ID: Jose Hartman, male    DOB: 1961/04/22, 60 y.o.   MRN: [id number]  Chief Complaint  Patient presents with  . New Patient (Initial Visit)  . left axis deviation  . Pre-op Exam   HPI:    Jose Hartman  is a 60 y.o. Caucasian male with history of tobacco use in the form of chewing tobacco, esophageal stricture SP dilatation in 2020, ureteric stones, has been scheduled for TURP and patient referred to me for preop cardiac risk ratification in view of abnormal EKG revealing left axis deviation.  He has a [place] acres of farm, has been able to do activities there without any chest pain or shortness of breath although admits to not being very active, accompanied by his wife at the bedside.  He is still chewing heavy tobacco, his wife also smokes cigarettes.  He is very disinclined to quitting chewing tobacco but often thinks about it.  Past Medical History:  Diagnosis Date  . Arthritis    left arm   . GERD (gastroesophageal reflux disease)   . History of kidney stones   . S/P balloon dilatation of esophageal stricture    Past Surgical History:  Procedure Laterality Date  . BALLOON DILATION N/A 08/13/2018   Procedure: BALLOON DILATION;  Surgeon: Ronnette Juniper, MD;  Location: [facility];  Service: Gastroenterology;  Laterality: N/A;  . COLONOSCOPY N/A 08/13/2018   Procedure: COLONOSCOPY;  Surgeon: Ronnette Juniper, MD;  Location: [facility];  Service: Gastroenterology;  Laterality: N/A;  . CYSTOSCOPY/RETROGRADE/URETEROSCOPY/STONE EXTRACTION WITH BASKET Left 09/09/2013   Procedure: CYSTOSCOPY/LEFT RETROGRADE PYELOGRAM /FLEXIBLE URETEROSCOPY/INSERTION LEFT URETERAL STENT;  Surgeon: Ardis Hughs, MD;  Location: [facility];  Service: Urology;  Laterality: Left;  . ESOPHAGOGASTRODUODENOSCOPY Left 05/15/2017   Procedure: ESOPHAGOGASTRODUODENOSCOPY (EGD);  Surgeon: Ronnette Juniper, MD;  Location: [facility];  Service: Gastroenterology;   Laterality: Left;  . ESOPHAGOGASTRODUODENOSCOPY N/A 08/13/2018   Procedure: ESOPHAGOGASTRODUODENOSCOPY (EGD);  Surgeon: Ronnette Juniper, MD;  Location: [facility];  Service: Gastroenterology;  Laterality: N/A;  . EXTRACORPOREAL SHOCK WAVE LITHOTRIPSY    . FRACTURE SURGERY     left wrist  . POLYPECTOMY  08/13/2018   Procedure: POLYPECTOMY;  Surgeon: Ronnette Juniper, MD;  Location: [facility];  Service: Gastroenterology;;  . SEPTOPLASTY WITH ETHMOIDECTOMY, AND MAXILLARY ANTROSTOMY Bilateral 09/14/2018   Procedure: SEPTOPLASTY WITH ETHMOIDECTOMY, AND MAXILLARY ANTROSTOMY,SPHENOIDECTOMY AND TISSUE REMOVAL AND FRONTAL RECESS Pilar Grammes;  Surgeon: Leta Baptist, MD;  Location: [facility];  Service: ENT;  Laterality: Bilateral;  . SINUS ENDO WITH FUSION Bilateral 09/14/2018   Procedure: SINUS ENDO WITH FUSION;  Surgeon: Leta Baptist, MD;  Location: [facility];  Service: ENT;  Laterality: Bilateral;  . Uretheral Stent placement     Family History  Problem Relation Age of Onset  . Heart disease Other   . Diabetes Mellitus II Other   . Esophageal cancer Brother   . Heart attack Father   . Cancer Brother     Social History   Tobacco Use  . Smoking status: Never Smoker  . Smokeless tobacco: Current User    Types: Chew  Substance Use Topics  . Alcohol use: Yes    Comment: rare beer   Marital Status: Married  ROS  Review of Systems  Cardiovascular: Negative for chest pain, dyspnea on exertion and leg swelling.  Gastrointestinal: Negative for melena.   Objective  Blood pressure 110/77, pulse 71, temperature 98.5 F (36.9 C), temperature source  Temporal, resp. rate 17, height 5\' 7"  (1.702 m), weight 159 lb (72.1 kg), SpO2 98 %.  Vitals with BMI 10/11/2020 04/14/2020 01/04/2020  Height 5\' 7"  5\' 7"  -  Weight 159 lbs 147 lbs -  BMI 08.1 44.81 -  Systolic 856 314 970  Diastolic 77 90 84  Pulse 71 69 84     Physical Exam Cardiovascular:     Rate and Rhythm: Normal rate and regular  rhythm.     Pulses: Intact distal pulses.     Heart sounds: Normal heart sounds. No murmur heard. No gallop.      Comments: No leg edema, no JVD. Pulmonary:     Effort: Pulmonary effort is normal.     Breath sounds: Normal breath sounds.  Abdominal:     General: Bowel sounds are normal.     Palpations: Abdomen is soft.    Laboratory examination:   Recent Labs    10/31/19 1313 01/04/20 2140  NA 142 139  K 4.3 3.6  CL 106 105  CO2 28 24  GLUCOSE 100* 112*  BUN 14 23*  CREATININE 0.70 1.01  CALCIUM 9.5 8.7*  GFRNONAA >60 >60  GFRAA >60 >60   CrCl cannot be calculated (Patient's most recent lab result is older than the maximum 21 days allowed.).  CMP Latest Ref Rng & Units 01/04/2020 10/31/2019 06/19/2018  Glucose 70 - 99 mg/dL 112(H) 100(H) 99  BUN 6 - 20 mg/dL 23(H) 14 11  Creatinine 0.61 - 1.24 mg/dL 1.01 0.70 0.80  Sodium 135 - 145 mmol/L 139 142 142  Potassium 3.5 - 5.1 mmol/L 3.6 4.3 4.0  Chloride 98 - 111 mmol/L 105 106 106  CO2 22 - 32 mmol/L 24 28 -  Calcium 8.9 - 10.3 mg/dL 8.7(L) 9.5 -  Total Protein 6.5 - 8.1 g/dL 6.9 7.5 -  Total Bilirubin 0.3 - 1.2 mg/dL 0.7 1.0 -  Alkaline Phos 38 - 126 U/L 67 66 -  AST 15 - 41 U/L 27 19 -  ALT 0 - 44 U/L 26 18 -   CBC Latest Ref Rng & Units 01/04/2020 10/31/2019 06/19/2018  WBC 4.0 - 10.5 K/uL 6.5 5.3 -  Hemoglobin 13.0 - 17.0 g/dL 14.9 15.2 13.6  Hematocrit 39.0 - 52.0 % 43.7 46.0 40.0  Platelets 150 - 400 K/uL 275 271 -    Lipid Panel No results for input(s): CHOL, TRIG, LDLCALC, VLDL, HDL, CHOLHDL, LDLDIRECT in the last 8760 hours.  HEMOGLOBIN A1C No results found for: HGBA1C, MPG TSH No results for input(s): TSH in the last 8760 hours.  External labs:   Labs 10/01/2020:  A1c 6.0%.  Total cholesterol 150, triglycerides 122, HDL 54, LDL 79, non-HDL cholesterol 96.  Medications and allergies   Allergies  Allergen Reactions  . Benadryl [Diphenhydramine Hcl] Other (See Comments)    Causes him to be hyper   . Peanuts [Peanut Oil] Hives    Makes ears red and burn      Outpatient Medications Prior to Visit  Medication Sig Dispense Refill  . albuterol (VENTOLIN HFA) 108 (90 Base) MCG/ACT inhaler Inhale 1-2 puffs into the lungs daily.    Marland Kitchen aspirin EC 81 MG tablet Take 81 mg by mouth daily.    Mariane Baumgarten Sodium (DSS) 100 MG CAPS Take by mouth.    . pantoprazole (PROTONIX) 40 MG tablet Take 40 mg by mouth 2 (two) times daily.    . pravastatin (PRAVACHOL) 40 MG tablet Take 40 mg by mouth at bedtime.    Marland Kitchen  QVAR REDIHALER 80 MCG/ACT inhaler Inhale 1 puff into the lungs 2 (two) times daily.    . sucralfate (CARAFATE) 1 g tablet Take 1 g by mouth 4 (four) times daily -  with meals and at bedtime.    . sucralfate (CARAFATE) 1 GM/10ML suspension Take 1 g by mouth 2 (two) times daily.    . tamsulosin (FLOMAX) 0.4 MG CAPS capsule Take 0.4 mg by mouth at bedtime.    . Alum & Mag Hydroxide-Simeth (MYLANTA PO) Take 1 Dose by mouth 2 (two) times daily. Liquid    . oxyCODONE-acetaminophen (PERCOCET) 5-325 MG tablet Take 1 tablet by mouth every 4 (four) hours as needed for severe pain. (Patient not taking: Reported on 10/31/2019) 25 tablet 0   No facility-administered medications prior to visit.    Radiology:   CT angiogram head and neck 10/31/2019: Bilateral common carotid and internal carotid and vertebral arteries are patent without significant stenosis, minimal calcified plaque left carotid bifurcation.  No significant intracranial arterial occlusion.  No acute infarction.  Cardiac Studies:   None  EKG:     EKG 10/11/2020: Normal sinus rhythm with rate of 66 bpm, left atrial enlargement, left axis deviation, left anterior fascicular block.  Incomplete right bundle branch block.  Nonspecific T abnormality.No significant change from  EKG 10/01/2020: Normal sinus rhythm at rate of 76 bpm, left axis deviation, left anterior fascicular block.  No evidence of ischemia.  No significant change from  10/31/2019.  Assessment     ICD-10-CM   1. Preoperative cardiovascular examination TURP 11/09/2020 Dr. Windell Norfolk  Z01.810   2. Left axis deviation  R94.31 EKG 12-Lead  3. Nonspecific abnormal electrocardiogram (ECG) (EKG)  R94.31   4. Smokeless tobacco use  Z72.0   5. Hypercholesteremia  E78.00 CT CARDIAC SCORING ([facility] LOCATIONS ONLY)    CANCELED: CT CARDIAC SCORING (SELF PAY ONLY)  6. History of TIA (transient ischemic attack)  Z86.73 CT CARDIAC SCORING ([facility] LOCATIONS ONLY)    CANCELED: CT CARDIAC SCORING (SELF PAY ONLY)    Medications Discontinued During This Encounter  Medication Reason  . oxyCODONE-acetaminophen (PERCOCET) 5-325 MG tablet Error  . Alum & Mag Hydroxide-Simeth (MYLANTA PO) Error    No orders of the defined types were placed in this encounter.  Orders Placed This Encounter  Procedures  . CT CARDIAC SCORING ([facility] LOCATIONS ONLY)    159 / no spinal stimulator, body injector or glucose monitor /nkda / no needs / self pay, $99 @ [facility] - pt aware Epic order/ miriam w pt 10/11/2020 no to covid ?s/ miriam 10/11/2020 pt aware no caffeine 24 hours prior, no heavy or strenuous exercise 6 hours prior/ miriam Pt aware to call and cancel/reschedule within 24 hrs to avoid $75 fee.    Standing Status:   Future    Standing Expiration Date:   12/11/2020    Order Specific Question:   Preferred imaging location?    Answer:   [facility]  . EKG 12-Lead   Recommendations:   Jose Hartman is a 60 y.o. Caucasian male with history of tobacco use in the form of chewing tobacco, esophageal stricture SP dilatation in 2020, ureteric stones, has been scheduled for TURP and patient referred to me for preop cardiac risk ratification in view of abnormal EKG revealing left axis deviation.  Patient essentially asymptomatic and has a [place] farm and has been able to walk around there without any chest pain or dyspnea, as he is going for a low to at  most intermediate risk surgery, he can undergo surgery with  acceptable risk.  With regard to risk factor modification, lipids are well controlled, he has no hypertension or no family history of premature coronary artery disease.  His EKG abnormality is nonspecific.  However after long discussions regarding chewing tobacco, risk of esophageal cancer especially since he has esophageal stricture in the past and has had dilatation in the past, he appears to be motivated.  I would like to obtain coronary calcium score to further stratify and if it is elevated, he states that he will be much more motivated to quit using tobacco.  I will set him up to see me back in 2 months for follow-up of the same.  Otherwise from cardiac standpoint I do not think he needs any testing for now.  He has had TIA in the past, however surprisingly the MRI/MRA of the brain had revealed no significant atherosclerotic burden.  Also his physical examination today is unremarkable including peripheral vascular examination.  I reviewed his external labs, also reviewed his external EKG personally.  I also spent additional 4 minutes to 5 minutes talking regarding cessation of tobacco use.    Adrian Prows, MD, [facility] 10/11/2020, 5:31 PM Office: [phone]

## 2020-10-26 ENCOUNTER — Ambulatory Visit
Admission: RE | Admit: 2020-10-26 | Discharge: 2020-10-26 | Disposition: A | Discharge status: Home / Self Care | Payer: Medicare Other | Source: Ambulatory Visit | Attending: Cardiology | Admitting: Cardiology

## 2020-10-26 DIAGNOSIS — E7800 Pure hypercholesterolemia, unspecified: Secondary | ICD-10-CM

## 2020-10-26 DIAGNOSIS — Z8673 Personal history of transient ischemic attack (TIA), and cerebral infarction without residual deficits: Secondary | ICD-10-CM

## 2020-10-28 NOTE — Progress Notes (Signed)
Coronary calcium score 10/26/2020: 1. Coronary calcium score of 0. 2. Moderate-sized hiatal hernia.

## 2020-11-06 ENCOUNTER — Other Ambulatory Visit: Payer: Self-pay

## 2020-11-06 ENCOUNTER — Other Ambulatory Visit (HOSPITAL_COMMUNITY)
Admission: RE | Admit: 2020-11-06 | Discharge: 2020-11-06 | Disposition: A | Discharge status: Home / Self Care | Payer: Medicare Other | Source: Ambulatory Visit | Attending: Urology | Admitting: Urology

## 2020-11-06 ENCOUNTER — Encounter (HOSPITAL_BASED_OUTPATIENT_CLINIC_OR_DEPARTMENT_OTHER): Payer: Self-pay | Admitting: Urology

## 2020-11-06 DIAGNOSIS — Z01812 Encounter for preprocedural laboratory examination: Secondary | ICD-10-CM | POA: Diagnosis present

## 2020-11-06 DIAGNOSIS — Z20822 Contact with and (suspected) exposure to covid-19: Secondary | ICD-10-CM | POA: Diagnosis not present

## 2020-11-06 LAB — SARS CORONAVIRUS 2 (TAT 6-24 HRS): SARS Coronavirus 2: NEGATIVE

## 2020-11-06 NOTE — Progress Notes (Signed)
Spoke w/ via phone for pre-op interview--- Pt and pt's wife Lab needs dos---- [employer]            Lab results------ current ekg in epic/ chart COVID test ------ done 11-06-2020 result in epic Arrive at ------- 0530 on 11-09-2020 NPO after MN NO Solid Food.  Clear liquids from MN until--- 0430 (pt verbalized this includes not chew tobacco) Med rec completed Medications to take morning of surgery ----- Protonix, Qvar inhaler Diabetic medication ----- n/a Patient instructed to bring photo id and insurance card day of surgery Patient aware to have Driver (ride ) / caregiver    for 24 hours after surgery -- wife, Anne Ng Patient Special Instructions ----- reviewed [facility] and visitor guidelines.  Asked to bring rescue inhaler and qvar inhaler dos Pre-Op special Istructions ----- pt has pcp and cardiac clearance's in epic / chart Patient verbalized understanding of instructions that were given at this phone interview. Patient denies shortness of breath, chest pain, fever, cough at this phone interview.   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 Pt states from fingers to shoulder has sensitivity to cold/ pain/ aches/ numbness in different areas of extremity / and weakness (able to left up to 10 lbs).  Hx TIA 03/ 2021 with no residual;  S/p esophageal dilatation's for stricture last one 11/ 2021;  Pt has cardiac clearance by dr Einar Gip , referred by  pt's pcp noting left axis deviation on ekg.  Pt denies any cardiac s&s, sob, and no peripheral swelling  PCP:  Adrienne Mocha PA  (lov 10-01-2020 epic) Cardiologist : Dr Einar Gip [facility] 10-11-2020 epic) GI:  Dr Therisa Doyne Chest x-ray :  11-22-2015 epic EKG : 10-11-2020 epic Cardiac CT:  10-26-2020 epic Echo : no Stress test: no Cardiac Cath :  no Activity level:  Denies sob w/ any activity Sleep Study/ CPAP :  NO Fasting Blood Sugar :      / Checks Blood Sugar -- times a day:  Pre-diabetes Blood Thinner/  Instructions /Last Dose:  NO ASA / Instructions/ Last Dose : ASA 81mg . Pt stated told to stop prior to surgery by Dr Abner Greenspan office, last dose 11-02-2020.

## 2020-11-08 NOTE — Anesthesia Preprocedure Evaluation (Addendum)
Anesthesia Evaluation  Patient identified by MRN, date of birth, ID band Patient awake    Reviewed: Allergy & Precautions, NPO status , Patient's Chart, lab work & pertinent test results  Airway Mallampati: I  TM Distance: >3 FB Neck ROM: Full    Dental  (+) Upper Dentures, Lower Dentures   Pulmonary asthma (well controlled) ,    Pulmonary exam normal        Cardiovascular negative cardio ROS   Rhythm:Regular Rate:Normal     Neuro/Psych negative neurological ROS  negative psych ROS   GI/Hepatic Neg liver ROS, GERD  Medicated,  Endo/Other  negative endocrine ROS  Renal/GU Renal disease   BPH    Musculoskeletal  (+) Arthritis , Osteoarthritis,    Abdominal (+)  Abdomen: soft. Bowel sounds: normal.  Peds  Hematology negative hematology ROS (+)   Anesthesia Other Findings   Reproductive/Obstetrics                            Anesthesia Physical Anesthesia Plan  ASA: II  Anesthesia Plan: General   Post-op Pain Management:    Induction: Intravenous  PONV Risk Score and Plan: 2 and Ondansetron, Dexamethasone, Midazolam and Treatment may vary due to age or medical condition  Airway Management Planned: Mask and LMA  Additional Equipment: None  Intra-op Plan:   Post-operative Plan: Extubation in OR  Informed Consent: I have reviewed the patients History and Physical, chart, labs and discussed the procedure including the risks, benefits and alternatives for the proposed anesthesia with the patient or authorized representative who has indicated his/her understanding and acceptance.     Dental advisory given  Plan Discussed with: CRNA  Anesthesia Plan Comments: (Lab Results      Component                Value               Date                      WBC                      6.5                 01/04/2020                HGB                      15.3                11/09/2020                 HCT                      45.0                11/09/2020                MCV                      90.7                01/04/2020                PLT                      275  01/04/2020           Lab Results      Component                Value               Date                      NA                       142                 11/09/2020                K                        3.7                 11/09/2020                CO2                      24                  01/04/2020                GLUCOSE                  113 (H)             11/09/2020                BUN                      16                  11/09/2020                CREATININE               0.80                11/09/2020                CALCIUM                  8.7 (L)             01/04/2020                GFRNONAA                 >60                 01/04/2020                GFRAA                    >60                 01/04/2020          )       Anesthesia Quick Evaluation

## 2020-11-09 ENCOUNTER — Ambulatory Visit (HOSPITAL_BASED_OUTPATIENT_CLINIC_OR_DEPARTMENT_OTHER): Payer: Medicare Other | Admitting: Anesthesiology

## 2020-11-09 ENCOUNTER — Ambulatory Visit (HOSPITAL_BASED_OUTPATIENT_CLINIC_OR_DEPARTMENT_OTHER)
Admission: RE | Admit: 2020-11-09 | Discharge: 2020-11-10 | Disposition: A | Discharge status: Home / Self Care | Payer: Medicare Other | Attending: Urology | Admitting: Urology

## 2020-11-09 ENCOUNTER — Encounter (HOSPITAL_BASED_OUTPATIENT_CLINIC_OR_DEPARTMENT_OTHER): Payer: Self-pay | Admitting: Urology

## 2020-11-09 ENCOUNTER — Encounter (HOSPITAL_BASED_OUTPATIENT_CLINIC_OR_DEPARTMENT_OTHER)
Admission: RE | Disposition: A | Discharge status: Home / Self Care | Payer: Self-pay | Source: Home / Self Care | Attending: Urology

## 2020-11-09 DIAGNOSIS — N138 Other obstructive and reflux uropathy: Secondary | ICD-10-CM | POA: Insufficient documentation

## 2020-11-09 DIAGNOSIS — Z91010 Allergy to peanuts: Secondary | ICD-10-CM | POA: Diagnosis not present

## 2020-11-09 DIAGNOSIS — N400 Enlarged prostate without lower urinary tract symptoms: Secondary | ICD-10-CM | POA: Diagnosis present

## 2020-11-09 DIAGNOSIS — N401 Enlarged prostate with lower urinary tract symptoms: Secondary | ICD-10-CM | POA: Insufficient documentation

## 2020-11-09 HISTORY — DX: Personal history of adenomatous and serrated colon polyps: Z86.0101

## 2020-11-09 HISTORY — PX: TRANSURETHRAL RESECTION OF PROSTATE: SHX73

## 2020-11-09 HISTORY — DX: Personal history of colonic polyps: Z86.010

## 2020-11-09 HISTORY — DX: Prediabetes: R73.03

## 2020-11-09 HISTORY — DX: Unspecified asthma, uncomplicated: J45.909

## 2020-11-09 HISTORY — DX: Hyperlipidemia, unspecified: E78.5

## 2020-11-09 HISTORY — DX: Benign prostatic hyperplasia with lower urinary tract symptoms: N40.1

## 2020-11-09 HISTORY — DX: Presence of dental prosthetic device (complete) (partial): Z97.2

## 2020-11-09 HISTORY — DX: Presence of spectacles and contact lenses: Z97.3

## 2020-11-09 HISTORY — DX: Complete loss of teeth, unspecified cause, unspecified class: K08.109

## 2020-11-09 LAB — POCT I-STAT, CHEM 8
BUN: 16 mg/dL (ref 8–23)
Calcium, Ion: 1.25 mmol/L (ref 1.15–1.40)
Chloride: 105 mmol/L (ref 98–111)
Creatinine, Ser: 0.8 mg/dL (ref 0.61–1.24)
Glucose, Bld: 113 mg/dL — ABNORMAL HIGH (ref 70–99)
HCT: 45 % (ref 39.0–52.0)
Hemoglobin: 15.3 g/dL (ref 13.0–17.0)
Potassium: 3.7 mmol/L (ref 3.5–5.1)
Sodium: 142 mmol/L (ref 135–145)
TCO2: 24 mmol/L (ref 22–32)

## 2020-11-09 SURGERY — TURP (TRANSURETHRAL RESECTION OF PROSTATE)
Anesthesia: General | Site: Prostate

## 2020-11-09 MED ORDER — OXYCODONE-ACETAMINOPHEN 5-325 MG PO TABS: 1.0000 | ORAL_TABLET | ORAL | Status: DC | PRN

## 2020-11-09 MED ORDER — TAMSULOSIN HCL 0.4 MG PO CAPS
0.8000 mg | ORAL_CAPSULE | Freq: Every day | ORAL | Status: DC
  Administered 2020-11-09: 0.8 mg via ORAL

## 2020-11-09 MED ORDER — BELLADONNA ALKALOIDS-OPIUM 16.2-60 MG RE SUPP
1.0000 | Freq: Four times a day (QID) | RECTAL | Status: DC | PRN
  Administered 2020-11-09: 1 via RECTAL

## 2020-11-09 MED ORDER — ACETAMINOPHEN 160 MG/5ML PO SOLN
ORAL | Status: AC
  Filled 2020-11-09: qty 20.3

## 2020-11-09 MED ORDER — FENTANYL CITRATE (PF) 100 MCG/2ML IJ SOLN
25.0000 ug | INTRAMUSCULAR | Status: DC | PRN
  Administered 2020-11-09: 50 ug via INTRAVENOUS
  Administered 2020-11-09 (×2): 25 ug via INTRAVENOUS

## 2020-11-09 MED ORDER — OXYBUTYNIN CHLORIDE ER 10 MG PO TB24
10.0000 mg | ORAL_TABLET | Freq: Every day | ORAL | Status: DC
  Administered 2020-11-09: 10 mg via ORAL

## 2020-11-09 MED ORDER — DEXAMETHASONE SODIUM PHOSPHATE 10 MG/ML IJ SOLN
INTRAMUSCULAR | Status: DC | PRN
  Administered 2020-11-09: 10 mg via INTRAVENOUS

## 2020-11-09 MED ORDER — ONDANSETRON HCL 4 MG/2ML IJ SOLN
4.0000 mg | INTRAMUSCULAR | Status: DC | PRN
  Administered 2020-11-09: 4 mg via INTRAVENOUS

## 2020-11-09 MED ORDER — BACITRACIN-NEOMYCIN-POLYMYXIN 400-5-5000 EX OINT
1.0000 "application " | TOPICAL_OINTMENT | Freq: Three times a day (TID) | CUTANEOUS | Status: DC | PRN
  Administered 2020-11-09: 1 via TOPICAL

## 2020-11-09 MED ORDER — PROPOFOL 10 MG/ML IV BOLUS
INTRAVENOUS | Status: DC | PRN
  Administered 2020-11-09: 150 mg via INTRAVENOUS

## 2020-11-09 MED ORDER — SODIUM CHLORIDE 0.9% FLUSH: 3.0000 mL | INTRAVENOUS | Status: DC | PRN

## 2020-11-09 MED ORDER — ACETAMINOPHEN 10 MG/ML IV SOLN: 1000.0000 mg | Freq: Once | INTRAVENOUS | Status: AC | PRN

## 2020-11-09 MED ORDER — FENTANYL CITRATE (PF) 100 MCG/2ML IJ SOLN
INTRAMUSCULAR | Status: DC | PRN
  Administered 2020-11-09: 50 ug via INTRAVENOUS
  Administered 2020-11-09: 25 ug via INTRAVENOUS

## 2020-11-09 MED ORDER — OXYCODONE HCL 5 MG/5ML PO SOLN
ORAL | Status: AC
  Filled 2020-11-09: qty 10

## 2020-11-09 MED ORDER — PANTOPRAZOLE SODIUM 40 MG PO TBEC
40.0000 mg | DELAYED_RELEASE_TABLET | Freq: Two times a day (BID) | ORAL | Status: DC
  Administered 2020-11-09: 40 mg via ORAL

## 2020-11-09 MED ORDER — BELLADONNA ALKALOIDS-OPIUM 16.2-30 MG RE SUPP
RECTAL | Status: AC
  Filled 2020-11-09: qty 1

## 2020-11-09 MED ORDER — LIDOCAINE 2% (20 MG/ML) 5 ML SYRINGE
INTRAMUSCULAR | Status: AC
  Filled 2020-11-09: qty 5

## 2020-11-09 MED ORDER — BECLOMETHASONE DIPROP HFA 80 MCG/ACT IN AERB
1.0000 | INHALATION_SPRAY | Freq: Two times a day (BID) | RESPIRATORY_TRACT | Status: DC
  Administered 2020-11-09: 1 via RESPIRATORY_TRACT

## 2020-11-09 MED ORDER — MIDAZOLAM HCL 2 MG/2ML IJ SOLN
INTRAMUSCULAR | Status: DC | PRN
  Administered 2020-11-09: 2 mg via INTRAVENOUS

## 2020-11-09 MED ORDER — TAMSULOSIN HCL 0.4 MG PO CAPS
ORAL_CAPSULE | ORAL | Status: AC
  Filled 2020-11-09: qty 2

## 2020-11-09 MED ORDER — BACITRACIN-NEOMYCIN-POLYMYXIN OINTMENT TUBE
TOPICAL_OINTMENT | CUTANEOUS | Status: AC
  Filled 2020-11-09: qty 14.17

## 2020-11-09 MED ORDER — SODIUM CHLORIDE 0.9 % IV SOLN: 250.0000 mL | INTRAVENOUS | Status: DC | PRN

## 2020-11-09 MED ORDER — ACETAMINOPHEN 160 MG/5ML PO SOLN: 325.0000 mg | ORAL | Status: DC | PRN

## 2020-11-09 MED ORDER — EPHEDRINE SULFATE-NACL 50-0.9 MG/10ML-% IV SOSY
PREFILLED_SYRINGE | INTRAVENOUS | Status: DC | PRN
  Administered 2020-11-09 (×3): 10 mg via INTRAVENOUS

## 2020-11-09 MED ORDER — SODIUM CHLORIDE 0.45 % IV SOLN: INTRAVENOUS | Status: DC

## 2020-11-09 MED ORDER — DEXAMETHASONE SODIUM PHOSPHATE 10 MG/ML IJ SOLN
INTRAMUSCULAR | Status: AC
  Filled 2020-11-09: qty 1

## 2020-11-09 MED ORDER — CEFAZOLIN SODIUM-DEXTROSE 2-4 GM/100ML-% IV SOLN
2.0000 g | Freq: Once | INTRAVENOUS | Status: AC
  Administered 2020-11-09: 2 g via INTRAVENOUS

## 2020-11-09 MED ORDER — ARTIFICIAL TEARS OPHTHALMIC OINT
TOPICAL_OINTMENT | OPHTHALMIC | Status: AC
  Filled 2020-11-09: qty 3.5

## 2020-11-09 MED ORDER — LACTATED RINGERS IV SOLN: INTRAVENOUS | Status: DC

## 2020-11-09 MED ORDER — ONDANSETRON HCL 4 MG/2ML IJ SOLN
INTRAMUSCULAR | Status: AC
  Filled 2020-11-09: qty 2

## 2020-11-09 MED ORDER — HEPARIN SODIUM (PORCINE) 5000 UNIT/ML IJ SOLN
5000.0000 [IU] | Freq: Three times a day (TID) | INTRAMUSCULAR | Status: DC
  Administered 2020-11-09 – 2020-11-10 (×3): 5000 [IU] via SUBCUTANEOUS

## 2020-11-09 MED ORDER — SODIUM CHLORIDE 0.9 % IR SOLN
3000.0000 mL | Status: DC
  Administered 2020-11-09 – 2020-11-10 (×2): 3000 mL

## 2020-11-09 MED ORDER — FENTANYL CITRATE (PF) 100 MCG/2ML IJ SOLN
INTRAMUSCULAR | Status: AC
  Filled 2020-11-09: qty 2

## 2020-11-09 MED ORDER — ZOLPIDEM TARTRATE 5 MG PO TABS: 5.0000 mg | ORAL_TABLET | Freq: Every evening | ORAL | Status: DC | PRN

## 2020-11-09 MED ORDER — OXYCODONE-ACETAMINOPHEN 5-325 MG PO TABS
ORAL_TABLET | ORAL | Status: AC
  Filled 2020-11-09: qty 2

## 2020-11-09 MED ORDER — SUCRALFATE 1 GM/10ML PO SUSP
1.0000 g | Freq: Two times a day (BID) | ORAL | Status: DC
  Administered 2020-11-09 (×2): 1 g via ORAL
  Filled 2020-11-09 (×2): qty 10

## 2020-11-09 MED ORDER — ACETAMINOPHEN 160 MG/5ML PO SOLN
650.0000 mg | ORAL | Status: DC | PRN
  Administered 2020-11-09 – 2020-11-10 (×5): 650 mg via ORAL

## 2020-11-09 MED ORDER — EPHEDRINE 5 MG/ML INJ
INTRAVENOUS | Status: AC
  Filled 2020-11-09: qty 10

## 2020-11-09 MED ORDER — ACETAMINOPHEN 325 MG PO TABS: 650.0000 mg | ORAL_TABLET | ORAL | Status: DC | PRN

## 2020-11-09 MED ORDER — OXYBUTYNIN CHLORIDE ER 10 MG PO TB24
ORAL_TABLET | ORAL | Status: AC
  Filled 2020-11-09: qty 1

## 2020-11-09 MED ORDER — PROPOFOL 10 MG/ML IV BOLUS
INTRAVENOUS | Status: AC
  Filled 2020-11-09: qty 20

## 2020-11-09 MED ORDER — HEPARIN SODIUM (PORCINE) 5000 UNIT/ML IJ SOLN
INTRAMUSCULAR | Status: AC
  Filled 2020-11-09: qty 1

## 2020-11-09 MED ORDER — KCL IN DEXTROSE-NACL 20-5-0.45 MEQ/L-%-% IV SOLN: INTRAVENOUS | Status: DC

## 2020-11-09 MED ORDER — PHENYLEPHRINE 40 MCG/ML (10ML) SYRINGE FOR IV PUSH (FOR BLOOD PRESSURE SUPPORT)
PREFILLED_SYRINGE | INTRAVENOUS | Status: DC | PRN
  Administered 2020-11-09: 80 ug via INTRAVENOUS
  Administered 2020-11-09: 120 ug via INTRAVENOUS

## 2020-11-09 MED ORDER — ALBUTEROL SULFATE HFA 108 (90 BASE) MCG/ACT IN AERS: 1.0000 | INHALATION_SPRAY | Freq: Four times a day (QID) | RESPIRATORY_TRACT | Status: DC | PRN

## 2020-11-09 MED ORDER — POLYETHYLENE GLYCOL 3350 17 G PO PACK: 17.0000 g | PACK | Freq: Every day | ORAL | Status: DC | PRN

## 2020-11-09 MED ORDER — HYDROMORPHONE HCL 1 MG/ML IJ SOLN: 0.5000 mg | INTRAMUSCULAR | Status: DC | PRN

## 2020-11-09 MED ORDER — PANTOPRAZOLE SODIUM 40 MG PO TBEC
DELAYED_RELEASE_TABLET | ORAL | Status: AC
  Filled 2020-11-09: qty 1

## 2020-11-09 MED ORDER — OXYCODONE HCL 5 MG/5ML PO SOLN
5.0000 mg | ORAL | Status: DC | PRN
  Administered 2020-11-09 – 2020-11-10 (×5): 10 mg via ORAL
  Filled 2020-11-09: qty 10

## 2020-11-09 MED ORDER — LIDOCAINE 2% (20 MG/ML) 5 ML SYRINGE
INTRAMUSCULAR | Status: DC | PRN
  Administered 2020-11-09: 60 mg via INTRAVENOUS

## 2020-11-09 MED ORDER — SODIUM CHLORIDE 0.9% FLUSH: 3.0000 mL | Freq: Two times a day (BID) | INTRAVENOUS | Status: DC

## 2020-11-09 MED ORDER — PROMETHAZINE HCL 25 MG/ML IJ SOLN: 6.2500 mg | INTRAMUSCULAR | Status: DC | PRN

## 2020-11-09 MED ORDER — 0.9 % SODIUM CHLORIDE (POUR BTL) OPTIME
TOPICAL | Status: DC | PRN
  Administered 2020-11-09: 500 mL

## 2020-11-09 MED ORDER — SUCRALFATE 1 G PO TABS: 1.0000 g | ORAL_TABLET | Freq: Two times a day (BID) | ORAL | Status: DC

## 2020-11-09 MED ORDER — CEFAZOLIN SODIUM-DEXTROSE 2-4 GM/100ML-% IV SOLN
INTRAVENOUS | Status: AC
  Filled 2020-11-09: qty 100

## 2020-11-09 MED ORDER — BISACODYL 10 MG RE SUPP
10.0000 mg | Freq: Every day | RECTAL | Status: DC | PRN
Start: 2020-11-09 — End: 2020-11-10

## 2020-11-09 MED ORDER — PRAVASTATIN SODIUM 40 MG PO TABS
40.0000 mg | ORAL_TABLET | Freq: Every day | ORAL | Status: DC
  Administered 2020-11-09: 40 mg via ORAL
  Filled 2020-11-09 (×2): qty 1

## 2020-11-09 MED ORDER — SODIUM CHLORIDE 0.9 % IR SOLN
Status: DC | PRN
  Administered 2020-11-09: 6000 mL via INTRAVESICAL
  Administered 2020-11-09: 3000 mL via INTRAVESICAL
  Administered 2020-11-09: 6000 mL via INTRAVESICAL

## 2020-11-09 MED ORDER — MIDAZOLAM HCL 2 MG/2ML IJ SOLN
INTRAMUSCULAR | Status: AC
  Filled 2020-11-09: qty 2

## 2020-11-09 MED ORDER — STERILE WATER FOR IRRIGATION IR SOLN
Status: DC | PRN
  Administered 2020-11-09: 500 mL

## 2020-11-09 MED ORDER — OXYCODONE-ACETAMINOPHEN 5-325 MG PO TABS: 1.0000 | ORAL_TABLET | ORAL | 0 refills | Status: AC | PRN

## 2020-11-09 MED ORDER — OXYBUTYNIN CHLORIDE ER 10 MG PO TB24: 10.0000 mg | ORAL_TABLET | Freq: Every day | ORAL | 0 refills | Status: AC

## 2020-11-09 SURGICAL SUPPLY — 25 items
BAG DRAIN URO-CYSTO SKYTR STRL (DRAIN) ×2 IMPLANT
BAG DRN RND TRDRP ANRFLXCHMBR (UROLOGICAL SUPPLIES) ×1
BAG DRN UROCATH (DRAIN) ×1
BAG URINE DRAIN 2000ML AR STRL (UROLOGICAL SUPPLIES) ×2 IMPLANT
BAG URINE LEG 500ML (DRAIN) IMPLANT
CATH FOLEY 2WAY SLVR 30CC 20FR (CATHETERS) IMPLANT
CATH FOLEY 3WAY 30CC 22FR (CATHETERS) ×2 IMPLANT
CLOTH BEACON ORANGE TIMEOUT ST (SAFETY) ×2 IMPLANT
ELECT BIVAP BIPO 22/24 DONUT (ELECTROSURGICAL) ×2
ELECT REM PT RETURN 9FT ADLT (ELECTROSURGICAL)
ELECTRD BIVAP BIPO 22/24 DONUT (ELECTROSURGICAL) IMPLANT
ELECTRODE REM PT RTRN 9FT ADLT (ELECTROSURGICAL) IMPLANT
GLOVE SURG ENC MOIS LTX SZ7 (GLOVE) ×2 IMPLANT
GOWN STRL REUS W/TWL LRG LVL3 (GOWN DISPOSABLE) ×2 IMPLANT
HOLDER FOLEY CATH W/STRAP (MISCELLANEOUS) ×2 IMPLANT
IV NS IRRIG 3000ML ARTHROMATIC (IV SOLUTION) ×9 IMPLANT
KIT TURNOVER CYSTO (KITS) ×2 IMPLANT
LOOP CUT BIPOLAR 24F LRG (ELECTROSURGICAL) ×2 IMPLANT
MANIFOLD NEPTUNE II (INSTRUMENTS) ×2 IMPLANT
PACK CYSTO (CUSTOM PROCEDURE TRAY) ×2 IMPLANT
SYR 30ML LL (SYRINGE) ×2 IMPLANT
SYR TOOMEY IRRIG 70ML (MISCELLANEOUS) ×2
SYRINGE TOOMEY IRRIG 70ML (MISCELLANEOUS) ×1 IMPLANT
TUBE CONNECTING 12X1/4 (SUCTIONS) ×2 IMPLANT
TUBING UROLOGY SET (TUBING) ×2 IMPLANT

## 2020-11-09 NOTE — Op Note (Signed)
Operative Note  Preoperative diagnosis:  1.  BPH with bladder outlet obstruction  Postoperative diagnosis: 1.  BPH with bladder outlet obstruction  Procedure(s): 1.  Bipolar transurethral resection of prostate  Surgeon: Rexene Alberts, MD  Assistants:  None  Anesthesia:  General  Complications:  None  EBL:  42ml  Specimens: 1. Prostate chips ID Type Source Tests Collected by Time Destination  1 : prostate chips Tissue PATH GU resection / TURBT / partial nephrectomy SURGICAL PATHOLOGY Janith Lima, MD 11/09/2020 [phone]    Drains/Catheters: 1.  22Fr 3 way catheter with 63ml water into balloon  Intraoperative findings:   1. Approximately 30 gram prostate successfully resected to capsule with wide open channel and excellent hemostasis.  Indication:  Jose Hartman is a 60 y.o. male with BPH with bladder outlet obstruction presenting for transurethral resection of the prostate. He had a TRUS volume of 32cm^3. Uroflow demonstrated average flow 4.57ml/sec with peak flow of 7.53ml/sec. Preop cystoscopy demonstrated bilobar obstruction with slightly elevated bladder neck. He has failed alpha blocker.  After thorough discussion including all relevant risk benefits and alternatives, he presents today for a bipolar TURP.  Description of procedure: The indication, alternatives, benefits and risks were discussed with the patient and informed consent was obtained.  Patient was brought to the operating room table, positioned supine, secured with a safety strap.  Pneumatic compression devices were placed on the lower extremities.  After the administration of intravenous antibiotics and general anesthesia, the patient was repositioned into the dorsal lithotomy position.  All pressure points were carefully padded.  A rectal examination was performed confirming a smooth symmetric enlarged gland.  The genitalia were prepped and draped in standard sterile manner.  A timeout was completed, verifying the  correct patient, surgical procedure and positioning prior to beginning the procedure.  Isotonic sodium chloride was used for irrigation.  Lucianne Lei buren sounds were used to dilate the urethra to 30 french. A 26 French continuous-flow resectoscope sheath with the visual obturator and a 30 degree lens was advanced under direct vision into the bladder.  The anterior urethra appeared normal in its entirety. The prostatic urethra demonstrated bilobar hyperplasia with an elevated bladder neck.  On cystoscopic evaluation, his bladder capacity appeared normal, the bladder wall was noted to expand symmetrically in all dimensions.  There were no tumors, stones or foreign bodies present. The bladder was not trabeculated with normal-appearing mucosa.  Both ureteral orifices were in their normal anatomic positions with clear urinary reflux noted bilaterally.  The obturator was removed and replaced by the working element with a resection loop.  The location of the ureteral orifices and the prostatic configuration were again confirmed.  Starting at the bladder neck and proceeding distally to the verumontanum a transurethral section of the prostate was performed using bipolar using energy of 4 and 5 for cutting and coagulation, respectively.  The procedure began at the bladder neck at the 5 o'clock and 7 o'clock positions and carefully carried distally to the verumontanum, resecting the intervening prostatic adenoma.  Next the left lateral lobe was resected to the level of the transverse capsular fibers.  The identical procedure was performed on the right lobe.  Attention was then directed anteriorly and the resection was completed from the 10 o'clock to 2 o'clock positions.  All bleeding vessels were fulgurated achieving meticulous hemostasis.  The bladder was irrigated with a Toomey syringe, ensuring removal of all prostate chips which were sent to pathology for evaluation.  Having completed the  resection and the chips  removed, we again confirmed hemostasis with the loop with coagulating current.  Upon completion of the entire procedure, the bladder and posterior urethra were reexamined, confirming open prostatic urethra and bladder neck without evidence of bleeding or perforation.  Both ureteral orifices and the external sphincter were noted to be intact.  The resectoscope was withdrawn under direct vision and a 22 French three-way Foley catheter with a 30 cc balloon was inserted into the bladder.  The balloon was inflated with 30 cc of sterile water.  After multiple manual irrigations ensuring clear return of the irrigant, the procedure was terminated.  The catheter was attached to a drainage bag and continuous bladder irrigation was started with normal saline.  The patient was positioned supine.  At the end of the procedure, all counts were correct.  Patient tolerated the procedure well and was taken to the recovery room satisfactory condition.  Plan: Continuous bladder irrigation overnight.  Plan to discharge home tomorrow with Foley catheter in place and void trial in the office in 3 days.  Matt R[facility]  Pager: [phone]

## 2020-11-09 NOTE — Transfer of Care (Signed)
Immediate Anesthesia Transfer of Care Note  Patient: Jose Hartman  Procedure(s) Performed: Procedure(s) (LRB): TRANSURETHRAL RESECTION OF THE PROSTATE (TURP), BIPOLAR (N/A)  Patient Location: PACU  Anesthesia Type: General  Level of Consciousness: awake, alert  and oriented  Airway & Oxygen Therapy: Patient Spontanous Breathing and Patient connected to nasal cannula oxygen  Post-op Assessment: Report given to PACU RN and Post -op Vital signs reviewed and stable  Post vital signs: Reviewed and stable  Complications: No apparent anesthesia complications  Last Vitals:  Vitals Value Taken Time  BP 122/74 11/09/20 0831  Temp    Pulse 70 11/09/20 0834  Resp 14 11/09/20 0834  SpO2 95 % 11/09/20 0834  Vitals shown include unvalidated device data.  Last Pain:  Vitals:   11/09/20 0554  TempSrc: Oral  PainSc: 0-No pain      Patients Stated Pain Goal: 4 (73/41/93 7902)  Complications: No complications documented.

## 2020-11-09 NOTE — Anesthesia Procedure Notes (Signed)
Procedure Name: LMA Insertion Date/Time: 11/09/2020 7:33 AM Performed by: Mechele Claude, CRNA Pre-anesthesia Checklist: Patient identified, Emergency Drugs available, Suction available and Patient being monitored Patient Re-evaluated:Patient Re-evaluated prior to induction Oxygen Delivery Method: Circle system utilized Preoxygenation: Pre-oxygenation with 100% oxygen Induction Type: IV induction Ventilation: Mask ventilation without difficulty LMA: LMA inserted LMA Size: 4.0 Number of attempts: 1 Airway Equipment and Method: Bite block Placement Confirmation: positive ETCO2 Tube secured with: Tape Dental Injury: Teeth and Oropharynx as per pre-operative assessment

## 2020-11-09 NOTE — Anesthesia Postprocedure Evaluation (Signed)
Anesthesia Post Note  Patient: Jose Hartman  Procedure(s) Performed: TRANSURETHRAL RESECTION OF THE PROSTATE (TURP), BIPOLAR (N/A Prostate)     Patient location during evaluation: PACU Anesthesia Type: General Level of consciousness: awake and alert Pain management: pain level controlled Vital Signs Assessment: post-procedure vital signs reviewed and stable Respiratory status: spontaneous breathing, nonlabored ventilation, respiratory function stable and patient connected to nasal cannula oxygen Cardiovascular status: blood pressure returned to baseline and stable Postop Assessment: no apparent nausea or vomiting Anesthetic complications: no   No complications documented.  Last Vitals:  Vitals:   11/09/20 0945 11/09/20 1335  BP: 109/76 100/75  Pulse: 80 94  Resp: 16 18  Temp: 36.5 C 36.7 C  SpO2: 95% 93%    Last Pain:  Vitals:   11/09/20 1228  TempSrc:   PainSc: Asleep                 Belenda Cruise P Rashod Gougeon

## 2020-11-09 NOTE — H&P (Signed)
Urology Preoperative H&P   Chief Complaint: BPH  History of Present Illness: Jose Hartman is a 60 y.o. male with BPH here for TURP. He denies fevers, chills.  Past Medical History:  Diagnosis Date  . Arthritis    left arm   . Benign localized prostatic hyperplasia with lower urinary tract symptoms (LUTS)   . Full dentures   . GERD (gastroesophageal reflux disease)   . History of adenomatous polyp of colon   . History of kidney stones   . Hyperlipidemia   . Moderate asthma without complication    followed by pcp--- (11-06-2020 pt stated last exacerbation several years , well controlled with daily qvar inhaler)  . Pre-diabetes   . S/P balloon dilatation of esophageal stricture    followed by dr Therisa Doyne (GI)---  [id number];  01/ 2020;  11/ 2021  . Wears glasses     Past Surgical History:  Procedure Laterality Date  . BALLOON DILATION N/A 08/13/2018   Procedure: BALLOON DILATION;  Surgeon: Ronnette Juniper, MD;  Location: [facility];  Service: Gastroenterology;  Laterality: N/A;  . COLONOSCOPY N/A 08/13/2018   Procedure: COLONOSCOPY;  Surgeon: Ronnette Juniper, MD;  Location: [facility];  Service: Gastroenterology;  Laterality: N/A;  . CYSTOSCOPY/RETROGRADE/URETEROSCOPY/STONE EXTRACTION WITH BASKET Left 09/09/2013   Procedure: CYSTOSCOPY/LEFT RETROGRADE PYELOGRAM /FLEXIBLE URETEROSCOPY/INSERTION LEFT URETERAL STENT;  Surgeon: Ardis Hughs, MD;  Location: [facility];  Service: Urology;  Laterality: Left;  . ESOPHAGOGASTRODUODENOSCOPY Left 05/15/2017   Procedure: ESOPHAGOGASTRODUODENOSCOPY (EGD);  Surgeon: Ronnette Juniper, MD;  Location: [facility];  Service: Gastroenterology;  Laterality: Left;  . ESOPHAGOGASTRODUODENOSCOPY N/A 08/13/2018   Procedure: ESOPHAGOGASTRODUODENOSCOPY (EGD);  Surgeon: Ronnette Juniper, MD;  Location: [facility];  Service: Gastroenterology;  Laterality: N/A;  . ESOPHAGOGASTRODUODENOSCOPY (EGD) WITH ESOPHAGEAL DILATION  11/ 2021  dr Therisa Doyne  . EXTRACORPOREAL SHOCK WAVE LITHOTRIPSY   yrs ago  . FRACTURE SURGERY  2002  approx.   left wrist, closed reduction   . POLYPECTOMY  08/13/2018   Procedure: POLYPECTOMY;  Surgeon: Ronnette Juniper, MD;  Location: [facility];  Service: Gastroenterology;;  . SEPTOPLASTY WITH ETHMOIDECTOMY, AND MAXILLARY ANTROSTOMY Bilateral 09/14/2018   Procedure: SEPTOPLASTY WITH ETHMOIDECTOMY, AND MAXILLARY ANTROSTOMY,SPHENOIDECTOMY AND TISSUE REMOVAL AND FRONTAL RECESS Pilar Grammes;  Surgeon: Leta Baptist, MD;  Location: [place] [facility];  Service: ENT;  Laterality: Bilateral;  . SINUS ENDO WITH FUSION Bilateral 09/14/2018   Procedure: SINUS ENDO WITH FUSION;  Surgeon: Leta Baptist, MD;  Location: [facility];  Service: ENT;  Laterality: Bilateral;    Allergies:  Allergies  Allergen Reactions  . Benadryl [Diphenhydramine Hcl] Other (See Comments)    Causes him to be hyper  . Peanuts [Peanut Oil] Hives    Makes ears red and burn     Family History  Problem Relation Age of Onset  . Heart disease Other   . Diabetes Mellitus II Other   . Esophageal cancer Brother   . Heart attack Father   . Cancer Brother     Social History:  reports that he has never smoked. His smokeless tobacco use includes chew. He reports current alcohol use. He reports that he does not use drugs.  ROS: A complete review of systems was performed.  All systems are negative except for pertinent findings as noted.  Physical Exam:  Vital signs in last 24 hours: Temp:  [97.6 F (36.4 C)] 97.6 F (36.4 C) (04/01 0554) Pulse Rate:  [68] 68 (04/01 0554) Resp:  [18] 18 (04/01 0554) BP: (134)/(83) 134/83 (04/01  0554) SpO2:  [98 %] 98 % (04/01 0554) Weight:  [71.8 kg] 71.8 kg (04/01 0554) Constitutional:  Alert and oriented, No acute distress Cardiovascular: Regular rate and rhythm Respiratory: Normal respiratory effort, Lungs clear bilaterally GI: Abdomen is soft, nontender, nondistended, no abdominal masses GU: No CVA tenderness Lymphatic: No  lymphadenopathy Neurologic: Grossly intact, no focal deficits Psychiatric: Normal mood and affect  Laboratory Data:  Recent Labs    11/09/20 0612  HGB 15.3  HCT 45.0    Recent Labs    11/09/20 0612  NA 142  K 3.7  CL 105  GLUCOSE 113*  BUN 16  CREATININE 0.80     Results for orders placed or performed during the hospital encounter of 11/09/20 (from the past 24 hour(s))  I-STAT, chem 8     Status: Abnormal   Collection Time: 11/09/20  6:12 AM  Result Value Ref Range   Sodium 142 135 - 145 mmol/L   Potassium 3.7 3.5 - 5.1 mmol/L   Chloride 105 98 - 111 mmol/L   BUN 16 8 - 23 mg/dL   Creatinine, Ser 0.80 0.61 - 1.24 mg/dL   Glucose, Bld 113 (H) 70 - 99 mg/dL   Calcium, Ion 1.25 1.15 - 1.40 mmol/L   TCO2 24 22 - 32 mmol/L   Hemoglobin 15.3 13.0 - 17.0 g/dL   HCT 45.0 39.0 - 52.0 %   Recent Results (from the past 240 hour(s))  SARS CORONAVIRUS 2 (TAT 6-24 HRS) Nasopharyngeal Nasopharyngeal Swab     Status: None   Collection Time: 11/06/20  2:09 PM   Specimen: Nasopharyngeal Swab  Result Value Ref Range Status   SARS Coronavirus 2 NEGATIVE NEGATIVE Final    Comment: (NOTE) SARS-CoV-2 target nucleic acids are NOT DETECTED.  The SARS-CoV-2 RNA is generally detectable in upper and lower respiratory specimens during the acute phase of infection. Negative results do not preclude SARS-CoV-2 infection, do not rule out co-infections with other pathogens, and should not be used as the sole basis for treatment or other patient management decisions. Negative results must be combined with clinical observations, patient history, and epidemiological information. The expected result is Negative.  Fact Sheet for Patients: SugarRoll.be  Fact Sheet for Healthcare Providers: https://www.woods-mathews.com/  This test is not yet approved or cleared by the [place] FDA and  has been authorized for detection and/or diagnosis of  SARS-CoV-2 by FDA under an Emergency Use Authorization (EUA). This EUA will remain  in effect (meaning this test can be used) for the duration of the COVID-19 declaration under Se ction 564(b)(1) of the Act, 21 U.S.C. section 360bbb-3(b)(1), unless the authorization is terminated or revoked sooner.  Performed at [facility], [place] [address], [place], [place] [address]     Renal Function: Recent Labs    11/09/20 3818  CREATININE 0.80   Estimated Creatinine Clearance: 90.7 mL/min (by C-G formula based on SCr of 0.8 mg/dL).  Radiologic Imaging: No results found.  I independently reviewed the above imaging studies.  Assessment and Plan Jose Hartman is a 60 y.o. male with BPH here for bipolar TURP.   Risks and benefits of TURP were reviewed in detail including infection, bleeding, blood transfusion, injury to bladder/urethra/surrounding structures, erectile dysfunction, urinary incontinence, bladder neck contracture, persistent obstructive and irritative voiding symptoms, and global anesthesia risks including but not limited to CVA, MI, DVT, PE, pneumonia, and death. He expressed understanding and desire to proceed.   Matt R. Anela Bensman MD 11/09/2020, 7:12 AM  [facility] Pager: [phone]): [phone]

## 2020-11-10 DIAGNOSIS — N401 Enlarged prostate with lower urinary tract symptoms: Secondary | ICD-10-CM | POA: Diagnosis not present

## 2020-11-10 LAB — BASIC METABOLIC PANEL
Anion gap: 9 (ref 5–15)
BUN: 16 mg/dL (ref 8–23)
CO2: 24 mmol/L (ref 22–32)
Calcium: 9 mg/dL (ref 8.9–10.3)
Chloride: 106 mmol/L (ref 98–111)
Creatinine, Ser: 0.99 mg/dL (ref 0.61–1.24)
GFR, Estimated: >60 mL/min (ref >60)
Glucose, Bld: 138 mg/dL — ABNORMAL HIGH (ref 70–99)
Potassium: 4.6 mmol/L (ref 3.5–5.1)
Sodium: 139 mmol/L (ref 135–145)

## 2020-11-10 LAB — CBC
HCT: 43.9 % (ref 39.0–52.0)
Hemoglobin: 14.2 g/dL (ref 13.0–17.0)
MCH: 30.1 pg (ref 26.0–34.0)
MCHC: 32.3 g/dL (ref 30.0–36.0)
MCV: 93 fL (ref 80.0–100.0)
Platelets: 227 10*3/uL (ref 150–400)
RBC: 4.72 MIL/uL (ref 4.22–5.81)
RDW: 13 % (ref 11.5–15.5)
WBC: 11.3 10*3/uL — ABNORMAL HIGH (ref 4.0–10.5)
nRBC: 0 % (ref 0.0–0.2)

## 2020-11-10 MED ORDER — OXYCODONE HCL 5 MG/5ML PO SOLN
ORAL | Status: AC
  Filled 2020-11-10: qty 10

## 2020-11-10 MED ORDER — ACETAMINOPHEN 160 MG/5ML PO SOLN
ORAL | Status: AC
  Filled 2020-11-10: qty 20.3

## 2020-11-10 MED ORDER — HEPARIN SODIUM (PORCINE) 5000 UNIT/ML IJ SOLN
INTRAMUSCULAR | Status: AC
  Filled 2020-11-10: qty 1

## 2020-11-10 NOTE — Discharge Summary (Signed)
Date of admission: 11/09/2020  Date of discharge: 11/10/2020  Admission diagnosis: BPH    Discharge diagnosis: BPH  Secondary diagnoses: None  History and Physical: For full details, please see admission history and physical. Briefly, Jose Hartman is a 60 y.o. male with BPH.   Hospital Course:  The patient underwent transurethral resection of prostate on 4.1.2022. Continuous bladder irrigation was initiated post-operatively. He tolerated the procedure well and was transferred to the floor after receiving routine post-operative care. His diet was gradually advanced, and his pain was controlled with oral analgesics.  By POD1, his urine had cleared on slow drip CBI, so CBI was stopped. His hematuria remained mild with CBI stopped and after ambulation. CBI was disconnected, and the 3rd port of his foley catheter was plugged.   By POD1, he was tolerating a regular diet, ambulating, having good urine output with mild hematuria via foley catheter, and having pain well-controlled with oral analgesics. Thus, he was deemed appropriate for discharge home. His foley catheter was continued on discharge.  Laboratory values:  Recent Labs    11/09/20 0612 11/10/20 0513  HGB 15.3 14.2  HCT 45.0 43.9   Recent Labs    11/09/20 0612  CREATININE 0.80   Physical Exam:  General: Alert and oriented CV: Regular rate Lungs: NWOB on RA Abdomen: Soft, nondistended, nontender GU: Foley in place draining clear urine without clots  Extremities: Warm and well-perfused   Disposition: Home  Discharge instruction: The patient was instructed to be ambulatory but told to refrain from heavy lifting, strenuous activity, or driving while on narcotics.   Discharge medications:     Followup:   Follow-up Information    [facility] On 11/12/2020.   Why: 8:30AM Contact information: [place] [phone]

## 2020-11-10 NOTE — Discharge Instructions (Signed)
Indwelling Urinary Catheter Care, Adult An indwelling urinary catheter is a thin tube that is put into your bladder. The tube helps to drain pee (urine) out of your body. The tube goes in through your urethra. Your urethra is where pee comes out of your body. Your pee will come out through the catheter, then it will go into a bag (drainage bag). Take good care of your catheter so it will work well. How to wear your catheter and bag Supplies needed Sticky tape (adhesive tape) or a leg strap. Alcohol wipe or soap and water (if you use tape). A clean towel (if you use tape). Large overnight bag. Smaller bag (leg bag). Wearing your catheter Attach your catheter to your leg with tape or a leg strap. Make sure the catheter is not pulled tight. If a leg strap gets wet, take it off and put on a dry strap. If you use tape to hold the bag on your leg: Use an alcohol wipe or soap and water to wash your skin where the tape made it sticky before. Use a clean towel to pat-dry that skin. Use new tape to make the bag stay on your leg. Wearing your bags You should have been given a large overnight bag. You may wear the overnight bag in the day or night. Always have the overnight bag lower than your bladder.  Do not let the bag touch the floor. Before you go to sleep, put a clean plastic bag in a wastebasket. Then hang the overnight bag inside the wastebasket. You should also have a smaller leg bag that fits under your clothes. Always wear the leg bag below your knee. Do not wear your leg bag at night. How to care for your skin and catheter Supplies needed A clean washcloth. Water and mild soap. A clean towel. Caring for your skin and catheter Clean the skin around your catheter every day: Wash your hands with soap and water. Wet a clean washcloth in warm water and mild soap. Clean the skin around your urethra. If you are male: Gently spread the folds of skin around your vagina (labia). With  the washcloth in your other hand, wipe the inner side of your labia on each side. Wipe from front to back. If you are male: Pull back any skin that covers the end of your penis (foreskin). With the washcloth in your other hand, wipe your penis in small circles. Start wiping at the tip of your penis, then move away from the catheter. Move the foreskin back in place, if needed. With your free hand, hold the catheter close to where it goes into your body. Keep holding the catheter during cleaning so it does not get pulled out. With the washcloth in your other hand, clean the catheter. Only wipe downward on the catheter. Do not wipe upward toward your body. Doing this may push germs into your urethra and cause infection. Use a clean towel to pat-dry the catheter and the skin around it. Make sure to wipe off all soap. Wash your hands with soap and water. Shower every day. Do not take baths. Do not use cream, ointment, or lotion on the area where the catheter goes into your body, unless your doctor tells you to. Do not use powders, sprays, or lotions on your genital area. Check your skin around the catheter every day for signs of infection. Check for: Redness, swelling, or pain. Fluid or blood. Warmth. Pus or a bad smell.  How to empty the bag Supplies needed Rubbing alcohol. Gauze pad or cotton ball. Tape or a leg strap. Emptying the bag Pour the pee out of your bag when it is ?- full, or at least 2-3 times a day. Do this for your overnight bag and your leg bag. Wash your hands with soap and water. Separate (detach) the bag from your leg. Hold the bag over the toilet or a clean pail. Keep the bag lower than your hips and bladder. This is so the pee (urine) does not go back into the tube. Open the pour spout. It is at the bottom of the bag. Empty the pee into the toilet or pail. Do not let the pour spout touch any surface. Put rubbing alcohol on a gauze pad or cotton ball. Use the  gauze pad or cotton ball to clean the pour spout. Close the pour spout. Attach the bag to your leg with tape or a leg strap. Wash your hands with soap and water. Follow instructions for cleaning the drainage bag: From the product maker. As told by your doctor. How to change the bag Supplies needed Alcohol wipes. A clean bag. Tape or a leg strap. Changing the bag Replace your bag when it starts to leak, smell bad, or look dirty. Wash your hands with soap and water. Separate the dirty bag from your leg. Pinch the catheter with your fingers so that pee does not spill out. Separate the catheter tube from the bag tube where these tubes connect (at the connection valve). Do not let the tubes touch any surface. Clean the end of the catheter tube with an alcohol wipe. Use a different alcohol wipe to clean the end of the bag tube. Connect the catheter tube to the tube of the clean bag. Attach the clean bag to your leg with tape or a leg strap. Do not make the bag tight on your leg. Wash your hands with soap and water. General rules Never pull on your catheter. Never try to take it out. Doing that can hurt you. Always wash your hands before and after you touch your catheter or bag. Use a mild, fragrance-free soap. If you do not have soap and water, use hand sanitizer. Always make sure there are no twists or bends (kinks) in the catheter tube. Always make sure there are no leaks in the catheter or bag. Drink enough fluid to keep your pee pale yellow. Do not take baths, swim, or use a hot tub. If you are male, wipe from front to back after you poop (have a bowel movement).   Contact a doctor if: Your pee is cloudy. Your pee smells worse than usual. Your catheter gets clogged. Your catheter leaks. Your bladder feels full. Get help right away if: You have redness, swelling, or pain where the catheter goes into your body. You have fluid, blood, pus, or a bad smell coming from the area where  the catheter goes into your body. Your skin feels warm where the catheter goes into your body. You have a fever. You have pain in your: Belly (abdomen). Legs. Lower back. Bladder. You see blood in the catheter. Your pee is pink or red. You feel sick to your stomach (nauseous). You throw up (vomit). You have chills. Your pee is not draining into the bag. Your catheter gets pulled out. Summary An indwelling urinary catheter is a thin tube that is placed into the bladder to help drain pee (urine) out of the body. The  catheter is placed into the part of the body that drains pee from the bladder (urethra). Taking good care of your catheter will keep it working properly and help prevent problems. Always wash your hands before and after touching your catheter or bag. Never pull on your catheter or try to take it out. This information is not intended to replace advice given to you by your health care provider. Make sure you discuss any questions you have with your health care provider. Document Revised: 11/19/2018 Document Reviewed: 03/13/2017 Elsevier Patient Education  2021 [place]. Transurethral Resection of the Prostate, Care After This sheet gives you information about how to care for yourself after your procedure. Your health care provider may also give you more specific instructions. If you have problems or questions, contact your health care provider. What can I expect after the procedure? After the procedure, it is common to have: Mild pain in your lower abdomen. Soreness or mild discomfort in your penis from having the catheter inserted during the procedure. A feeling of urgency when you need to urinate. A small amount of blood in your urine. You may notice some small blood clots in your urine. These are normal. Follow these instructions at home: Medicines Take over-the-counter and prescription medicines only as told by your health care provider. If you were prescribed an  antibiotic medicine, take it as told by your health care provider. Do not stop taking the antibiotic even if you start to feel better. Ask your health care provider if the medicine prescribed to you: Requires you to avoid driving or using heavy machinery. Can cause constipation. You may need to take actions to prevent or treat constipation, such as: Take over-the-counter or prescription medicines. Eat foods that are high in fiber, such as fresh fruits and vegetables, whole grains, and beans. Limit foods that are high in fat and processed sugars, such as fried or sweet foods. Do not drive for 24 hours if you were given a sedative during your procedure. Activity Return to your normal activities as told by your health care provider. Ask your health care provider what activities are safe for you. Do not lift anything that is heavier than 10 lb (4.5 kg), or the limit that you are told, for 3 weeks after the procedure or until your health care provider says that it is safe. Avoid intense physical activity for as long as told by your health care provider. Avoid sitting for a long time without moving. Get up and move around one or more times every few hours. This helps to prevent blood clots. You may increase your physical activity gradually as you start to feel better.   Lifestyle Do not drink alcohol for as long as told by your health care provider. This is especially important if you are taking prescription pain medicines. Do not engage in sexual activity until your health care provider says that you can do this. General instructions Do not take baths, swim, or use a hot tub until your health care provider approves. Drink enough fluid to keep your urine pale yellow. Urinate as soon as you feel the need to. Do not try to hold your urine for long periods of time. If your health care provider approves, you may take a stool softener for 2-3 weeks to prevent you from straining to have a bowel  movement. Wear compression stockings as told by your health care provider. These stockings help to prevent blood clots and reduce swelling in your legs. Keep all  follow-up visits as told by your health care provider. This is important.   Contact a health care provider if you have: Difficulty urinating. A fever. Pain that gets worse or does not improve with medicine. Blood in your urine that does not go away after 1 week of resting and drinking more fluids. Swelling in your penis or testicles. Get help right away if: You are unable to urinate. You are having more blood clots in your urine instead of fewer. You have: Large blood clots. A lot of blood in your urine. Pain in your back or lower abdomen. Pain or swelling in your legs. Chills and you are shaking. Difficulty breathing or shortness of breath. Summary After the procedure, it is common to have a small amount of blood in your urine. Avoid heavy lifting and intense physical activity for as long as told by your health care provider. Urinate as soon as you feel the need to. Do not try to hold your urine for long periods of time. Keep all follow-up visits as told by your health care provider. This is important. This information is not intended to replace advice given to you by your health care provider. Make sure you discuss any questions you have with your health care provider. Document Revised: 11/17/2018 Document Reviewed: 04/28/2018 Elsevier Patient Education  2021 [place].  Activity:  You are encouraged to ambulate frequently (about every hour during waking hours) to help prevent blood clots from forming in your legs or lungs.     Diet: You should advance your diet as instructed by your physician.  It will be normal to have some bloating, nausea, and abdominal discomfort intermittently.   Prescriptions:  You will be provided a prescription for pain medication to take as needed.  If your pain is not severe enough to  require the prescription pain medication, you may take extra strength Tylenol instead which will have less side effects.  You should also take a prescribed stool softener to avoid straining with bowel movements as the prescription pain medication may constipate you.   What to call us about: You should call the office [phone]) if you develop fever > 101 or develop persistent vomiting. Activity:  You are encouraged to ambulate frequently (about every hour during waking hours) to help prevent blood clots from forming in your legs or lungs.    You have a foley catheter in place. Return to clinic in 3 days for foley removal.

## 2020-11-12 ENCOUNTER — Encounter (HOSPITAL_BASED_OUTPATIENT_CLINIC_OR_DEPARTMENT_OTHER): Payer: Self-pay | Admitting: Urology

## 2020-11-13 LAB — SURGICAL PATHOLOGY

## 2020-12-06 ENCOUNTER — Ambulatory Visit: Payer: Medicare Other | Admitting: Cardiology

## 2020-12-06 ENCOUNTER — Encounter: Payer: Self-pay | Admitting: Neurology

## 2021-01-25 ENCOUNTER — Other Ambulatory Visit: Payer: Self-pay

## 2021-01-25 DIAGNOSIS — R202 Paresthesia of skin: Secondary | ICD-10-CM

## 2021-01-29 ENCOUNTER — Encounter: Payer: Medicare Other | Admitting: Neurology

## 2021-02-13 ENCOUNTER — Other Ambulatory Visit: Payer: Self-pay

## 2021-02-13 ENCOUNTER — Ambulatory Visit: Payer: Medicare Other | Admitting: Neurology

## 2021-02-13 DIAGNOSIS — R202 Paresthesia of skin: Secondary | ICD-10-CM | POA: Diagnosis not present

## 2021-02-13 NOTE — Procedures (Signed)
[facility]  [place], [place]  [place], [place] [address] Tel: [phone] Fax:  [phone] Test Date:  02/13/2021  Patient: Jose Hartman DOB: [birth year] Physician: Narda Amber, DO  Sex: Male Height: 5\' 7"  Ref Phys: Adrienne Mocha, PA-C  ID#: [id number]   Technician:    Patient Complaints: This is a 60 year old man referred for evaluation of right forearm numbness tingling following peripheral IV placement.  NCV & EMG Findings: Extensive electrodiagnostic testing of the right upper extremity shows:  Right median, ulnar, mixed palmar, medial antebrachial, and lateral antebrachial cutaneous sensory responses are within normal limits.   Right median and ulnar motor responses are within normal limits.   There is no evidence of active or chronic motor axon loss changes affecting any of the tested muscles.  There is incomplete motor unit activation as seen by a variable recruitment pattern which may be due to poor effort, pain, and less likely central disorder of motor unit control.    Impression: This is a normal study of the right upper extremity.  In particular, there is no evidence of carpal tunnel syndrome, brachial plexopathy, cervical radiculopathy, or sensorimotor polyneuropathy.   ___________________________ Narda Amber, DO    Nerve Conduction Studies Anti Sensory Summary Table   Stim Site NR Peak (ms) Norm Peak (ms) P-T Amp (V) Norm P-T Amp  Right Lat Ante Brach Cutan Anti Sensory (Lat Forearm)  32C  Lat Biceps    2.3 <2.8 15.3 >10  Right Med Ante Brach Cutan Anti Sensory (Med Forearm)  32C  Elbow    1.9  17.8   Right Median Anti Sensory (2nd Digit)  32C  Wrist    3.6 <3.8 17.9 >10  Right Ulnar Anti Sensory (5th Digit)  32C  Wrist    3.0 <3.2 7.8 >5   Motor Summary Table   Stim Site NR Onset (ms) Norm Onset (ms) O-P Amp (mV) Norm O-P Amp Site1 Site2 Delta-0 (ms) Dist (cm) Vel (m/s) Norm Vel (m/s)  Right Median Motor (Abd Poll Brev)   32C  Wrist    3.4 <4.0 9.7 >5 Elbow Wrist 4.8 30.0 63 >50  Elbow    8.2  9.1         Right Ulnar Motor (Abd Dig Minimi)  32C  Wrist    2.2 <3.1 10.4 >7 B Elbow Wrist 4.1 22.0 54 >50  B Elbow    6.3  9.9  A Elbow B Elbow 1.8 10.0 56 >50  A Elbow    8.1  9.4          Comparison Summary Table   Stim Site NR Peak (ms) Norm Peak (ms) P-T Amp (V) Site1 Site2 Delta-P (ms) Norm Delta (ms)  Right Median/Ulnar Palm Comparison (Wrist - 8cm)  32C  Median Palm    2.0 <2.2 20.2 Median Palm Ulnar Palm 0.1   Ulnar Palm    1.9 <2.2 11.0       EMG   Side Muscle Ins Act Fibs Psw Fasc Number Recrt Dur Dur. Amp Amp. Poly Poly. Comment  Right 1stDorInt Nml Nml Nml Nml 1- Mod-V Nml Nml Nml Nml Nml Nml N/A  Right PronatorTeres Nml Nml Nml Nml 1- Mod-V Nml Nml Nml Nml Nml Nml N/A  Right Biceps Nml Nml Nml Nml 1- Mod-V Nml Nml Nml Nml Nml Nml N/A  Right Triceps Nml Nml Nml Nml 1- Mod-V Nml Nml Nml Nml Nml Nml N/A  Right Deltoid Nml Nml Nml Nml 1- Mod-V Nml  Nml Nml Nml Nml Nml N/A      Waveforms:

## 2021-09-26 ENCOUNTER — Encounter: Payer: Self-pay | Admitting: Neurology

## 2021-10-04 NOTE — Progress Notes (Signed)
Assessment/Plan:    idiopathic Parkinson's disease.  The patient has tremor, bradykinesia, rigidity and mild postural instability.  -We discussed the diagnosis as well as pathophysiology of the disease.  We discussed treatment options as well as prognostic indicators.  Patient education was provided.  -We decided to add carbidopa/levodopa 25/100.  1/2 tab tid x 1 wk, then 1/2 in am & noon & 1 at night for a week, then 1/2 in am &1 at noon &night for a week, then 1 po tid.  Risks, benefits, side effects and alternative therapies were discussed.  The opportunity to ask questions was given and they were answered to the best of my ability.  The patient expressed understanding and willingness to follow the outlined treatment protocols.  -I will refer the patient to the [facility]'s [facility], for PT.We talked about the importance of safe, cardiovascular exercise in Parkinson's disease.  -We discussed community resources in the area including patient support groups and community exercise programs for PD and pt education was provided to the patient.  He met with my LCSW today.  2.  Chronic pain along with diffuse giveaway weakness  -Patient is reestablishing with pain management.  -This could be a barrier to future Parkinson's treatment.  Discussed with him that Parkinson's itself is not a painful process.  -He is somewhat hyperreflexic but declines MRI.  States that he was told he had steel dust in the lungs.  From what I can tell, he has had 2 negative chest x-rays looking for this.  CT brain has been unremarkable.  He has had CT cervical spine in the remote past.   3.  B12 deficiency  -pt with evidence of B12 deficiency.  Discussed with the patient that neurologically, would like to see B12 levels greater than 400.  Would recommend oral B12 supplementation, 1000 mcg daily.  Patient states that he recently started a B12 supplement, but is not sure how much.  He will check  and let me know.   Subjective:   Jose Hartman was seen today in the movement disorders clinic for neurologic consultation at the request of Pieter Partridge, PA.  The consultation is for the evaluation of bilateral hand tremor, right greater than left.  Notes indicate that this has been going on since summer, 2022.  Notes from primary care indicate that this is both a rest as well as postural tremor, starting in the right hand and proceeding to the left hand.  When he was seen by primary care on February 14, he was started on primidone, 50 mg at bedtime.  He does state that it was helpful.  He was sent today for further evaluation.  MRI was ordered, but then the patient called back and stated that he was told he had "steel dust" in his lungs and he should not have another MRI (2017 CXR doesn't demonstrate any any evidence of metal and a CXR specifically for metal in 2007 that was neg).  No MRI is available for review.  Tremor: Yes.     How long has it been going on? 9 months  At rest or with activation?  both  When is it noted the most?  "Most any time".    Fam hx of tremor?  No. And no fam hx of Parkinsons Disease   Located where?  R>L.  He is L hand dominant.  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  He states that he has paresthesias in the R pinky, ring and middle finger are numb after a prostate surgery  Affected by caffeine:  No.  Affected by alcohol:  No.  Affected by stress:  No.  Affected by fatigue:  No.  Affects ADL's (tying shoes, brushing teeth, etc):  No.  Tremor inducing meds:  No.  Other Specific Symptoms:  Voice: yes, told due to "bad asthma" Sleep:   Vivid Dreams:  No.  Acting out dreams:  No. (Did in past) Wet Pillows: No. Postural symptoms:  No.  Falls?  No. Bradykinesia symptoms: shuffling gait and slow movements Loss of smell:  Yes.   Loss of taste:  No. Urinary  Incontinence:  No. (Better after prostate sx) Difficulty Swallowing:  No. (Better after esophageal stretch) Handwriting, micrographia: Yes.   Depression:  No. Memory changes:  Yes.   (Trouble with names; wife has always done home finances) N/V:  No. Lightheaded:  No.  Syncope: No. Diplopia:  No. Dyskinesia:  No.    ALLERGIES:   Allergies  Allergen Reactions   Benadryl [Diphenhydramine Hcl] Other (See Comments)    Causes him to be hyper   Peanuts [Peanut Oil] Hives    Makes ears red and burn     CURRENT MEDICATIONS:  Current Outpatient Medications  Medication Instructions   albuterol (VENTOLIN HFA) 108 (90 Base) MCG/ACT inhaler 1-2 puffs, Inhalation, Every 6 hours PRN   aspirin EC 81 mg, Oral, Daily   Docusate Sodium (DSS) 100 MG CAPS 1 capsule, Oral, 3 times daily   pantoprazole (PROTONIX) 40 mg, Oral, 2 times daily   pravastatin (PRAVACHOL) 40 mg, Oral, Daily at bedtime   primidone (MYSOLINE) 50 mg, Oral, Daily   QVAR REDIHALER 80 MCG/ACT inhaler 1 puff, Inhalation, 2 times daily   sucralfate (CARAFATE) 1 g, Oral, 2 times daily   tamsulosin (FLOMAX) 0.8 mg, Daily at bedtime    Objective:   PHYSICAL EXAMINATION:    VITALS:   Vitals:   10/07/21 0952  BP: 128/65  Pulse: 74  SpO2: 99%  Weight: 157 lb (71.2 kg)  Height: 5' 6"  (1.676 m)    GEN:  The patient appears stated age and is in NAD. HEENT:  Normocephalic, atraumatic.  The mucous membranes are moist. The superficial temporal arteries are without ropiness or tenderness. CV:  RRR Lungs:  CTAB Neck/HEME:  There are no carotid bruits bilaterally.  Neurological examination:  Orientation: The patient is alert and oriented x3.  Cranial nerves: There is good facial symmetry.  There is facial hypomimia.  Extraocular muscles are intact. The visual fields are full to confrontational testing. The speech is fluent and clear. Soft palate rises symmetrically and there is no tongue deviation. Hearing is intact to  conversational tone. Sensation: Sensation is intact to light touch throughout (facial, trunk, extremities). Vibration is intact at the bilateral big toe. There is no extinction with double simultaneous stimulation.  Motor: there is diffuse give way weakness that improves some with encouragement.   Deep tendon reflexes: Deep tendon reflexes are 2+-3-/4 at the bilateral biceps, triceps, brachioradialis, patella and achilles. Plantar responses are downgoing bilaterally.  Movement examination: Tone: There is mild to moderate increased tone in the right upper extremity.  Tone elsewhere is normal, including right lower extremity. Abnormal movements: There is mild right upper extremity rest tremor.  Distraction procedures attempted, but patient reports he is unable to do  any of these. Coordination:  There is extreme slowness with all rapid alternating movements (functional), but does not appear there is decremation.  It is very difficult to tell due to slowness on both sides. Gait and Station: The patient pushes off to arise.  Stride length is good with decreased arm swing on the right. I have reviewed and interpreted the following labs independently   Chemistry      Component Value Date/Time   NA 139 11/10/2020 0513   K 4.6 11/10/2020 0513   CL 106 11/10/2020 0513   CO2 24 11/10/2020 0513   BUN 16 11/10/2020 0513   CREATININE 0.99 11/10/2020 0513   CREATININE 0.86 12/11/2015 1456      Component Value Date/Time   CALCIUM 9.0 11/10/2020 0513   ALKPHOS 67 01/04/2020 2140   AST 27 01/04/2020 2140   ALT 26 01/04/2020 2140   BILITOT 0.7 01/04/2020 2140      No results found for: TSH Lab Results  Component Value Date   WBC 11.3 (H) 11/10/2020   HGB 14.2 11/10/2020   HCT 43.9 11/10/2020   MCV 93.0 11/10/2020   PLT 227 11/10/2020   No results found for: VITAMINB12 (just done by PCP and was 205)    Total time spent on today's visit was 73 minutes, including both face-to-face time and  nonface-to-face time.  Time included that spent on review of records (prior notes available to me/labs/imaging if pertinent), discussing treatment and goals, answering patient's questions and coordinating care.  Cc:  Pieter Partridge, PA

## 2021-10-07 ENCOUNTER — Other Ambulatory Visit: Payer: Self-pay

## 2021-10-07 ENCOUNTER — Encounter: Payer: Self-pay | Admitting: Neurology

## 2021-10-07 ENCOUNTER — Ambulatory Visit: Payer: Medicare Other | Admitting: Neurology

## 2021-10-07 VITALS — BP 128/65 | HR 74 | Ht 66.0 in | Wt 157.0 lb

## 2021-10-07 DIAGNOSIS — G20 Parkinson's disease: Secondary | ICD-10-CM

## 2021-10-07 DIAGNOSIS — E538 Deficiency of other specified B group vitamins: Secondary | ICD-10-CM

## 2021-10-07 MED ORDER — CARBIDOPA-LEVODOPA 25-100 MG PO TABS: ORAL_TABLET | ORAL | 1 refills | Status: AC

## 2021-10-07 NOTE — Patient Instructions (Addendum)
You have been diagnosed with low B12.  We recommend that you take over the counter B12, 1000 micrograms daily.  Start Carbidopa Levodopa as follows: Take 1/2 tablet three times daily, at least 30 minutes before meals (approximately 8am/noon/4pm), for one week Then take 1/2 tablet in the morning, 1/2 tablet in the afternoon, 1 tablet in the evening, at least 30 minutes before meals, for one week Then take 1/2 tablet in the morning, 1 tablet in the afternoon, 1 tablet in the evening, at least 30 minutes before meals, for one week Then take 1 tablet three times daily at 8am/noon/4pm, at least 30 minutes before meals   As a reminder, carbidopa/levodopa can be taken at the same time as a carbohydrate, but we like to have you take your pill either 30 minutes before a protein source or 1 hour after as protein can interfere with carbidopa/levodopa absorption.

## 2021-10-24 ENCOUNTER — Encounter: Payer: Self-pay | Admitting: Physical Therapy

## 2021-10-24 ENCOUNTER — Ambulatory Visit: Payer: Medicare Other | Attending: Neurology | Admitting: Physical Therapy

## 2021-10-24 ENCOUNTER — Other Ambulatory Visit: Payer: Self-pay

## 2021-10-24 DIAGNOSIS — R2681 Unsteadiness on feet: Secondary | ICD-10-CM | POA: Diagnosis present

## 2021-10-24 DIAGNOSIS — R293 Abnormal posture: Secondary | ICD-10-CM | POA: Insufficient documentation

## 2021-10-24 DIAGNOSIS — G20 Parkinson's disease: Secondary | ICD-10-CM | POA: Insufficient documentation

## 2021-10-24 DIAGNOSIS — R2689 Other abnormalities of gait and mobility: Secondary | ICD-10-CM | POA: Diagnosis present

## 2021-10-24 DIAGNOSIS — M6281 Muscle weakness (generalized): Secondary | ICD-10-CM | POA: Insufficient documentation

## 2021-10-25 NOTE — Therapy (Signed)
?[facility] ?[address], [address] ?[place], [place], [address] ?Phone: [phone]   Fax:  [phone] ? ?Physical Therapy Evaluation ? ?Patient Details  ?Name: Jose Hartman ?MRN: [id number] ?Date of Birth: 1961/07/04 ?Referring Provider (PT): Alonza Bogus, DO ? ? ?Encounter Date: 10/24/2021 ? ? PT End of Session - 10/24/21 1256   ? ? Visit Number 1   ? Number of Visits 8   ? Date for PT Re-Evaluation 12/20/21   ? Authorization Type [employer] Medicare   ? PT Start Time [phone]   ? PT Stop Time 0930   ? PT Time Calculation (min) 38 min   ? ?  ?  ? ?  ? ? ?Past Medical History:  ?Diagnosis Date  ? Arthritis   ? left arm   ? Asthma   ? Benign localized prostatic hyperplasia with lower urinary tract symptoms (LUTS)   ? Full dentures   ? GERD (gastroesophageal reflux disease)   ? History of adenomatous polyp of colon   ? History of kidney stones   ? Hyperlipidemia   ? Moderate asthma without complication   ? followed by pcp--- (11-06-2020 pt stated last exacerbation several years , well controlled with daily qvar inhaler)  ? Pre-diabetes   ? S/P balloon dilatation of esophageal stricture   ? followed by dr Therisa Doyne (GI)---  [id number];  01/ 2020;  11/ 2021  ? Wears glasses   ? ? ?Past Surgical History:  ?Procedure Laterality Date  ? BALLOON DILATION N/A 08/13/2018  ? Procedure: BALLOON DILATION;  Surgeon: Ronnette Juniper, MD;  Location: [facility];  Service: Gastroenterology;  Laterality: N/A;  ? COLONOSCOPY N/A 08/13/2018  ? Procedure: COLONOSCOPY;  Surgeon: Ronnette Juniper, MD;  Location: [facility];  Service: Gastroenterology;  Laterality: N/A;  ? CYSTOSCOPY/RETROGRADE/URETEROSCOPY/STONE EXTRACTION WITH BASKET Left 09/09/2013  ? Procedure: CYSTOSCOPY/LEFT RETROGRADE PYELOGRAM /FLEXIBLE URETEROSCOPY/INSERTION LEFT URETERAL STENT;  Surgeon: Ardis Hughs, MD;  Location: [facility];  Service: Urology;  Laterality: Left;  ? ESOPHAGOGASTRODUODENOSCOPY Left 05/15/2017  ? Procedure:  ESOPHAGOGASTRODUODENOSCOPY (EGD);  Surgeon: Ronnette Juniper, MD;  Location: [facility];  Service: Gastroenterology;  Laterality: Left;  ? ESOPHAGOGASTRODUODENOSCOPY N/A 08/13/2018  ? Procedure: ESOPHAGOGASTRODUODENOSCOPY (EGD);  Surgeon: Ronnette Juniper, MD;  Location: [facility];  Service: Gastroenterology;  Laterality: N/A;  ? ESOPHAGOGASTRODUODENOSCOPY (EGD) WITH ESOPHAGEAL DILATION  11/ 2021  dr Therisa Doyne  ? EXTRACORPOREAL SHOCK WAVE LITHOTRIPSY  yrs ago  ? FRACTURE SURGERY  2002  approx.  ? left wrist, closed reduction   ? POLYPECTOMY  08/13/2018  ? Procedure: POLYPECTOMY;  Surgeon: Ronnette Juniper, MD;  Location: [facility];  Service: Gastroenterology;;  ? SEPTOPLASTY WITH ETHMOIDECTOMY, AND MAXILLARY ANTROSTOMY Bilateral 09/14/2018  ? Procedure: SEPTOPLASTY WITH ETHMOIDECTOMY, AND MAXILLARY ANTROSTOMY,SPHENOIDECTOMY AND TISSUE REMOVAL AND FRONTAL RECESS EXLORATION;  Surgeon: Leta Baptist, MD;  Location: [facility];  Service: ENT;  Laterality: Bilateral;  ? SINUS ENDO WITH FUSION Bilateral 09/14/2018  ? Procedure: SINUS ENDO WITH FUSION;  Surgeon: Leta Baptist, MD;  Location: [facility];  Service: ENT;  Laterality: Bilateral;  ? TRANSURETHRAL RESECTION OF PROSTATE N/A 11/09/2020  ? Procedure: TRANSURETHRAL RESECTION OF THE PROSTATE (TURP), BIPOLAR;  Surgeon: Janith Lima, MD;  Location: [facility];  Service: Urology;  Laterality: N/A;  ? ? ?There were no vitals filed for this visit. ? ? ? Subjective Assessment - 10/24/21 0854   ? ? Subjective Pt reports recently being dx with Parkinson's disease.  Has hx of RSD  on LUE.  Pt reports improvement with addition of Sinemet.   ? Pertinent History RSD, Asthma, low back pain, GERD   ? Patient Stated Goals Pt's goals for therapy is to help exercise with this Parkinson's.   ? Currently in Pain? No/denies   ? ?  ?  ? ?  ? ? ? ? ? [facility] PT Assessment - 10/24/21 0901   ? ?  ? Assessment  ? Medical Diagnosis Parkinson's disease   ? Referring Provider (PT)  Alonza Bogus, DO   ? Onset Date/Surgical Date 10/07/21   ? Hand Dominance Right   ?  ? Precautions  ? Precautions Other (comment)   ? Precaution Comments LUE decreased strength   ?  ? Balance Screen  ? Has the patient fallen in the past 6 months No   ? Has the patient had a decrease in activity level because of a fear of falling?  No   ? Is the patient reluctant to leave their home because of a fear of falling?  No   ?  ? Home Environment  ? Living Environment Private residence   ? Living Arrangements Spouse/significant other   ? Available Help at Discharge Family   ? Type of Home Mobile home   ? Home Access Stairs to enter   ? Entrance Stairs-Number of Steps 4   ? Entrance Stairs-Rails Right;Left   ? Home Layout One level   ? Home Equipment Other (comment)   ? Additional Comments Pt provides caregiving support for wife; he does grocery shopping, cooking   ?  ? Prior Function  ? Level of Independence Independent   ? Vocation On disability   ? Leisure Enjoys being outside, was a farmer prior to being on disability.   ?  ? Observation/Other Assessments  ? Focus on Therapeutic Outcomes (FOTO)  NA   ?  ? Posture/Postural Control  ? Posture/Postural Control Postural limitations   ? Postural Limitations Rounded Shoulders;Forward head   ? Posture Comments Resting RUE tremor   ?  ? Tone  ? Assessment Location Right Lower Extremity;Left Lower Extremity   ?  ? ROM / Strength  ? AROM / PROM / Strength AROM;Strength   ?  ? AROM  ? Overall AROM  Within functional limits for tasks performed   ?  ? Strength  ? Overall Strength Deficits   ? Strength Assessment Site Hip;Knee;Ankle   ? Right/Left Hip Right;Left   ? Right Hip Flexion 3+/5   ? Left Hip Flexion 4/5   ? Right/Left Knee Right;Left   ? Right Knee Flexion 3+/5   ? Right Knee Extension 3+/5   ? Left Knee Flexion 4/5   ? Left Knee Extension 4/5   ? Right/Left Ankle Right;Left   ? Right Ankle Dorsiflexion 3+/5   ? Left Ankle Dorsiflexion 3+/5   ?  ? Transfers  ? Transfers Sit  to Stand;Stand to Sit   ? Sit to Stand 5: Supervision;6: Modified independent (Device/Increase time);Without upper extremity assist;From chair/3-in-1   ? Five time sit to stand comments  35.72   ? Stand to Sit 5: Supervision;Without upper extremity assist;To chair/3-in-1   ?  ? Ambulation/Gait  ? Ambulation/Gait Yes   ? Ambulation/Gait Assistance 5: Supervision   ? Ambulation Distance (Feet) 180 Feet   ? Assistive device None   ? Gait Pattern Step-through pattern;Decreased arm swing - right;Decreased step length - right;Decreased hip/knee flexion - right;Decreased dorsiflexion - right;Decreased trunk rotation;Poor foot clearance - right   ?  Ambulation Surface Level;Indoor   ? Gait velocity 18.16 sec = 1.81 ft/sec   ?  ? Standardized Balance Assessment  ? Standardized Balance Assessment Timed Up and Go Test;Dynamic Gait Index   ?  ? Dynamic Gait Index  ? Level Surface Moderate Impairment   ? Change in Gait Speed Moderate Impairment   ? Gait with Horizontal Head Turns Severe Impairment   ? Gait with Vertical Head Turns Severe Impairment   ? Gait and Pivot Turn Moderate Impairment   ? Step Over Obstacle Moderate Impairment   ? Step Around Obstacles Mild Impairment   ? Steps Moderate Impairment   ? Total Score 7   ? DGI comment: Scores <19/24 indicate increased fall risk   ?  ? Timed Up and Go Test  ? Normal TUG (seconds) 18.22   ? Cognitive TUG (seconds) 28.18   slowed pace; counts back by 1's  ? TUG Comments Scores >10% difference indicates difficulty with dual tasking; >13.5-15 sec indicates increased fall risk.   ?  ? RLE Tone  ? RLE Tone Mild   ?  ? LLE Tone  ? LLE Tone Within Functional Limits   ? ?  ?  ? ?  ? ? ? ? ? ? ? ? ? ? ? ? ? ?Objective measurements completed on examination: See above findings.  ? ? ? ? ? ? ? ? ? ? ? ? ? ? PT Education - 10/24/21 1255   ? ? Education Details PT eval results, PT POC   ? Person(s) Educated Patient   ? Methods Explanation   ? Comprehension Verbalized understanding   ? ?  ?   ? ?  ? ? ? PT Short Term Goals - 10/24/21 1305   ? ?  ? PT SHORT TERM GOAL #1  ? Title Pt will be independent with HEP for improved strength, balance, transfers, and gait.  TARGET 11/22/2021   ? Time 4   ? Period Weeks   ? Status

## 2021-10-28 ENCOUNTER — Telehealth: Payer: Self-pay | Admitting: Neurology

## 2021-10-28 NOTE — Telephone Encounter (Signed)
Pt's wife called in wanting to see if Dr. Carles Collet can start prescribing his primidone? His PCP is leaving the practice soon. ?

## 2021-10-28 NOTE — Telephone Encounter (Signed)
Patients wife said that she feels it really helps him especially at night with sleeping and tremors in the night time. He was prescribed this before the parkinson's diagnosis. I did explain why they need to find another PCP and they are going to do this  ?

## 2021-10-30 ENCOUNTER — Other Ambulatory Visit: Payer: Self-pay

## 2021-10-30 ENCOUNTER — Ambulatory Visit: Payer: Medicare Other | Admitting: Physical Therapy

## 2021-10-30 ENCOUNTER — Encounter: Payer: Self-pay | Admitting: Physical Therapy

## 2021-10-30 DIAGNOSIS — M6281 Muscle weakness (generalized): Secondary | ICD-10-CM

## 2021-10-30 DIAGNOSIS — R2689 Other abnormalities of gait and mobility: Secondary | ICD-10-CM

## 2021-10-30 DIAGNOSIS — R2681 Unsteadiness on feet: Secondary | ICD-10-CM

## 2021-10-30 NOTE — Therapy (Signed)
[facility] ?[address], [address] ?[place], [place], [address] ?Phone: [phone]   Fax:  [phone] ? ?Physical Therapy Treatment ? ?Patient Details  ?Name: Jose Hartman ?MRN: [id number] ?Date of Birth: Apr 03, 1961 ?Referring Provider (PT): Alonza Bogus, DO ? ? ?Encounter Date: 10/30/2021 ? ? PT End of Session - 10/30/21 1555   ? ? Visit Number 2   ? Number of Visits 8   ? Date for PT Re-Evaluation 12/20/21   ? Authorization Type UHC Medicare   ? PT Start Time 3976   ? PT Stop Time 1020   ? PT Time Calculation (min) 45 min   ? Activity Tolerance Patient tolerated treatment well   ? Behavior During Therapy [facility] for tasks assessed/performed   ? ?  ?  ? ?  ? ? ?Past Medical History:  ?Diagnosis Date  ? Arthritis   ? left arm   ? Asthma   ? Benign localized prostatic hyperplasia with lower urinary tract symptoms (LUTS)   ? Full dentures   ? GERD (gastroesophageal reflux disease)   ? History of adenomatous polyp of colon   ? History of kidney stones   ? Hyperlipidemia   ? Moderate asthma without complication   ? followed by pcp--- (11-06-2020 pt stated last exacerbation several years , well controlled with daily qvar inhaler)  ? Pre-diabetes   ? S/P balloon dilatation of esophageal stricture   ? followed by dr Therisa Doyne (GI)---  [id number];  01/ 2020;  11/ 2021  ? Wears glasses   ? ? ?Past Surgical History:  ?Procedure Laterality Date  ? BALLOON DILATION N/A 08/13/2018  ? Procedure: BALLOON DILATION;  Surgeon: Ronnette Juniper, MD;  Location: [facility];  Service: Gastroenterology;  Laterality: N/A;  ? COLONOSCOPY N/A 08/13/2018  ? Procedure: COLONOSCOPY;  Surgeon: Ronnette Juniper, MD;  Location: [facility];  Service: Gastroenterology;  Laterality: N/A;  ? CYSTOSCOPY/RETROGRADE/URETEROSCOPY/STONE EXTRACTION WITH BASKET Left 09/09/2013  ? Procedure: CYSTOSCOPY/LEFT RETROGRADE PYELOGRAM /FLEXIBLE URETEROSCOPY/INSERTION LEFT URETERAL STENT;  Surgeon: Ardis Hughs, MD;  Location: [facility];   Service: Urology;  Laterality: Left;  ? ESOPHAGOGASTRODUODENOSCOPY Left 05/15/2017  ? Procedure: ESOPHAGOGASTRODUODENOSCOPY (EGD);  Surgeon: Ronnette Juniper, MD;  Location: [facility];  Service: Gastroenterology;  Laterality: Left;  ? ESOPHAGOGASTRODUODENOSCOPY N/A 08/13/2018  ? Procedure: ESOPHAGOGASTRODUODENOSCOPY (EGD);  Surgeon: Ronnette Juniper, MD;  Location: [facility];  Service: Gastroenterology;  Laterality: N/A;  ? ESOPHAGOGASTRODUODENOSCOPY (EGD) WITH ESOPHAGEAL DILATION  11/ 2021  dr Therisa Doyne  ? EXTRACORPOREAL SHOCK WAVE LITHOTRIPSY  yrs ago  ? FRACTURE SURGERY  2002  approx.  ? left wrist, closed reduction   ? POLYPECTOMY  08/13/2018  ? Procedure: POLYPECTOMY;  Surgeon: Ronnette Juniper, MD;  Location: [facility];  Service: Gastroenterology;;  ? SEPTOPLASTY WITH ETHMOIDECTOMY, AND MAXILLARY ANTROSTOMY Bilateral 09/14/2018  ? Procedure: SEPTOPLASTY WITH ETHMOIDECTOMY, AND MAXILLARY ANTROSTOMY,SPHENOIDECTOMY AND TISSUE REMOVAL AND FRONTAL RECESS EXLORATION;  Surgeon: Leta Baptist, MD;  Location: [facility];  Service: ENT;  Laterality: Bilateral;  ? SINUS ENDO WITH FUSION Bilateral 09/14/2018  ? Procedure: SINUS ENDO WITH FUSION;  Surgeon: Leta Baptist, MD;  Location: [facility];  Service: ENT;  Laterality: Bilateral;  ? TRANSURETHRAL RESECTION OF PROSTATE N/A 11/09/2020  ? Procedure: TRANSURETHRAL RESECTION OF THE PROSTATE (TURP), BIPOLAR;  Surgeon: Janith Lima, MD;  Location: [facility];  Service: Urology;  Laterality: N/A;  ? ? ?There were no vitals filed for this visit. ? ? Subjective Assessment - 10/30/21 0931   ? ?  Subjective Pt reports his medicine (Sinemet) makes him get real hot: lasts approximately 15"-20"; sometimes lasts about 30" - most recent occurrence was last week   ? Pertinent History RSD, Asthma, low back pain, GERD   ? Patient Stated Goals Pt's goals for therapy is to help exercise with this Parkinson's.   ? Currently in Pain? No/denies   ? ?  ?  ? ?   ? ? ? ? ? ? ? ? ? ? ? ? ? ? ? ? ? ? ? ? [facility] Adult PT Treatment/Exercise - 10/30/21 0937   ? ?  ? Transfers  ? Transfers Sit to Stand   ? Sit to Stand 5: Supervision   ? Number of Reps 10 reps   ? Comments pt needed LUE support: cues to shift weight over onto RLE for increased weight bearing and strengthening - performed from mat   ?  ? Ambulation/Gait  ? Ambulation/Gait Yes   ? Ambulation/Gait Assistance 5: Supervision   ? Ambulation Distance (Feet) 120 Feet   ? Assistive device None   ? Gait Pattern Step-through pattern;Decreased arm swing - right;Decreased stance time - right;Decreased hip/knee flexion - right   ? Ambulation Surface Level;Indoor   ? Gait Comments cues for increased step length, upright posture and incr. Rt arm swing   ?  ? Neuro Re-ed   ? Neuro Re-ed Details  Pt performed large amplitude trunk rotation with UE's held in abduction; pt performed 6 reps prior to c/o fatigue in RUE, at which time this ex. was stopped;  Standing weight shifts laterally 10 reps inside // bars without UE support   ?  ? Exercises  ? Exercises Knee/Hip;Ankle   ?  ? Knee/Hip Exercises: Stretches  ? Active Hamstring Stretch Right;1 rep;20 seconds   Runner's stretch in standing inside // bars  ? Gastroc Stretch Right;1 rep;20 seconds   on 2" step  ?  ? Knee/Hip Exercises: Aerobic  ? Nustep level 4 x 7" with UE's & LE's   ?  ? Knee/Hip Exercises: Standing  ? Heel Raises Both;Right;2 sets;10 reps   10 reps bilateral LE's with min. UE support; 10 reps RLE only with UE support on // bars  ? ?  ?  ? ?  ? ? ?Pt performed the following exercises in standing inside // bars to have UE support prn for assist with balance ? ?Access Code: [email or web address] ?URL: [email or web address] ?Date: 10/30/2021 ?Prepared by: Ethelene Browns ? ?Exercises ?Mini Squat with Counter Support - 1-2 x daily - 7 x weekly - 1 sets - 10 reps ?Standing Weight Shift Side to Side - 1-2 x daily - 7 x weekly - 1 sets - 10 reps ?Side to Side Weight Shift with  Overhead Reach and Counter Support - 1-2 x daily - 7 x weekly - 1 sets - 10 reps ?Alternating Step Forward with Support - 1-2 x daily - 7 x weekly - 1 sets - 10 reps ?Alternating Backward Step - 1-2 x daily - 7 x weekly - 1 sets - 10 reps ?Alternating Side Step - 1-2 x daily - 7 x weekly - 1 sets - 10 reps ? ? Balance Exercises - 10/30/21 0001   ? ?  ? Balance Exercises: Standing  ? Stepping Strategy Anterior;Posterior;Lateral;10 reps   each leg - without UE support; performed inside // bars  ? ?  ?  ? ?  ? ? ? ? ? PT Education - 10/30/21 1057   ? ? Education  Details HEP - Medbridge [id number]; pt inquired as to what kind of bike would be recommended for home use - pt was instructed to get a recumbent bike rather than one with a small seat without a backrest   ? Person(s) Educated Patient   ? Methods Explanation;Demonstration;Handout   ? Comprehension Verbalized understanding;Returned demonstration   ? ?  ?  ? ?  ? ? ? PT Short Term Goals - 10/30/21 1602   ? ?  ? PT SHORT TERM GOAL #1  ? Title Pt will be independent with HEP for improved strength, balance, transfers, and gait.  TARGET 11/22/2021   ? Time 4   ? Period Weeks   ? Status New   ?  ? PT SHORT TERM GOAL #2  ? Title Pt will improve 5x sit<>stand to less than or equal to 25 sec for imrpoved ease of transfers, improved functional strength.   ? Baseline 35.72 sec   ? Time 4   ? Period Weeks   ? Status New   ?  ? PT SHORT TERM GOAL #3  ? Title Pt will improve DGI score to at least 12/24 for decreased fall risk.   ? Time 4   ? Period Weeks   ? Status New   ?  ? PT SHORT TERM GOAL #4  ? Title Pt will verbalize understanding of local Parkinson's resources.   ? Time 4   ? Period Weeks   ? Status New   ? ?  ?  ? ?  ? ? ? ? PT Long Term Goals - 10/30/21 1602   ? ?  ? PT LONG TERM GOAL #1  ? Title Pt will be independent with HEP for improved strength, balance, transfers, and gait.  TARGET 12/20/2021   ? Time 8   ? Period Weeks   ? Status New   ?  ? PT LONG TERM GOAL #2  ?  Title Pt will improve 5x sit<>stand to less than or equal to 20 sec to demonstrate improved functional strength and transfer efficiency.   ? Time 8   ? Period Weeks   ? Status New   ?  ? PT LONG TERM GOAL #3  ? Title Pt wil

## 2021-11-01 ENCOUNTER — Encounter: Payer: Self-pay | Admitting: Physical Therapy

## 2021-11-01 ENCOUNTER — Other Ambulatory Visit: Payer: Self-pay

## 2021-11-01 ENCOUNTER — Ambulatory Visit: Payer: Medicare Other | Admitting: Physical Therapy

## 2021-11-01 DIAGNOSIS — R2681 Unsteadiness on feet: Secondary | ICD-10-CM

## 2021-11-01 DIAGNOSIS — R2689 Other abnormalities of gait and mobility: Secondary | ICD-10-CM

## 2021-11-01 DIAGNOSIS — M6281 Muscle weakness (generalized): Secondary | ICD-10-CM

## 2021-11-01 NOTE — Therapy (Signed)
[facility] ?[address], [address] ?[place], [place], [address] ?Phone: [phone]   Fax:  [phone] ? ?Physical Therapy Treatment ? ?Patient Details  ?Name: Jose Hartman ?MRN: [id number] ?Date of Birth: [birth year] ?Referring Provider (PT): Alonza Bogus, DO ? ? ?Encounter Date: 11/01/2021 ? ? PT End of Session - 11/01/21 0300   ? ? Visit Number 3   ? Number of Visits 8   ? Date for PT Re-Evaluation 12/20/21   ? Authorization Type UHC Medicare   ? PT Start Time 5374   ? PT Stop Time 1017   ? PT Time Calculation (min) 42 min   ? Activity Tolerance Patient tolerated treatment well   ? Behavior During Therapy [facility] for tasks assessed/performed   ? ?  ?  ? ?  ? ? ?Past Medical History:  ?Diagnosis Date  ? Arthritis   ? left arm   ? Asthma   ? Benign localized prostatic hyperplasia with lower urinary tract symptoms (LUTS)   ? Full dentures   ? GERD (gastroesophageal reflux disease)   ? History of adenomatous polyp of colon   ? History of kidney stones   ? Hyperlipidemia   ? Moderate asthma without complication   ? followed by pcp--- (11-06-2020 pt stated last exacerbation several years , well controlled with daily qvar inhaler)  ? Pre-diabetes   ? S/P balloon dilatation of esophageal stricture   ? followed by dr Therisa Doyne (GI)---  [phone];  01/ 2020;  11/ 2021  ? Wears glasses   ? ? ?Past Surgical History:  ?Procedure Laterality Date  ? BALLOON DILATION N/A 08/13/2018  ? Procedure: BALLOON DILATION;  Surgeon: Ronnette Juniper, MD;  Location: [facility];  Service: Gastroenterology;  Laterality: N/A;  ? COLONOSCOPY N/A 08/13/2018  ? Procedure: COLONOSCOPY;  Surgeon: Ronnette Juniper, MD;  Location: [facility];  Service: Gastroenterology;  Laterality: N/A;  ? CYSTOSCOPY/RETROGRADE/URETEROSCOPY/STONE EXTRACTION WITH BASKET Left 09/09/2013  ? Procedure: CYSTOSCOPY/LEFT RETROGRADE PYELOGRAM /FLEXIBLE URETEROSCOPY/INSERTION LEFT URETERAL STENT;  Surgeon: Ardis Hughs, MD;  Location: [facility];   Service: Urology;  Laterality: Left;  ? ESOPHAGOGASTRODUODENOSCOPY Left 05/15/2017  ? Procedure: ESOPHAGOGASTRODUODENOSCOPY (EGD);  Surgeon: Ronnette Juniper, MD;  Location: [facility];  Service: Gastroenterology;  Laterality: Left;  ? ESOPHAGOGASTRODUODENOSCOPY N/A 08/13/2018  ? Procedure: ESOPHAGOGASTRODUODENOSCOPY (EGD);  Surgeon: Ronnette Juniper, MD;  Location: [facility];  Service: Gastroenterology;  Laterality: N/A;  ? ESOPHAGOGASTRODUODENOSCOPY (EGD) WITH ESOPHAGEAL DILATION  11/ 2021  dr Therisa Doyne  ? EXTRACORPOREAL SHOCK WAVE LITHOTRIPSY  yrs ago  ? FRACTURE SURGERY  2002  approx.  ? left wrist, closed reduction   ? POLYPECTOMY  08/13/2018  ? Procedure: POLYPECTOMY;  Surgeon: Ronnette Juniper, MD;  Location: [facility];  Service: Gastroenterology;;  ? SEPTOPLASTY WITH ETHMOIDECTOMY, AND MAXILLARY ANTROSTOMY Bilateral 09/14/2018  ? Procedure: SEPTOPLASTY WITH ETHMOIDECTOMY, AND MAXILLARY ANTROSTOMY,SPHENOIDECTOMY AND TISSUE REMOVAL AND FRONTAL RECESS EXLORATION;  Surgeon: Leta Baptist, MD;  Location: [facility];  Service: ENT;  Laterality: Bilateral;  ? SINUS ENDO WITH FUSION Bilateral 09/14/2018  ? Procedure: SINUS ENDO WITH FUSION;  Surgeon: Leta Baptist, MD;  Location: [facility];  Service: ENT;  Laterality: Bilateral;  ? TRANSURETHRAL RESECTION OF PROSTATE N/A 11/09/2020  ? Procedure: TRANSURETHRAL RESECTION OF THE PROSTATE (TURP), BIPOLAR;  Surgeon: Janith Lima, MD;  Location: [facility];  Service: Urology;  Laterality: N/A;  ? ? ?There were no vitals filed for this visit. ? ? Subjective Assessment - 11/01/21 0932   ? ?  Subjective Pt reports that yesterday was "a bad day"; reports tremors were worse and he had alot of emotional lability; doesn't know why he felt that way.  Took Sinemet at 8:00 am today but RUE is still tremoring.  Pt reports he did the HEP yesterday and did OK - and also got out and walked alot yesterday   ? Pertinent History RSD, Asthma, low back pain, GERD   ? Patient  Stated Goals Pt's goals for therapy is to help exercise with this Parkinson's.   ? Currently in Pain? No/denies   ? ?  ?  ? ?  ? ? ? ? ? ? ? ? ? ? ? ? ? ? ? ? ? ? ? ? [facility] Adult PT Treatment/Exercise - 11/01/21 0936   ? ?  ? Ambulation/Gait  ? Ambulation/Gait Yes   ? Ambulation/Gait Assistance 5: Supervision   ? Ambulation Distance (Feet) 172 Feet   ? Assistive device None   ? Gait Pattern Step-through pattern;Decreased arm swing - right;Decreased stance time - right;Decreased hip/knee flexion - right   ? Ambulation Surface Level;Indoor   ? Gait Comments cues to elevate Rt shoulder, increase step length and initial heel contact   ?  ? Neuro Re-ed   ? Neuro Re-ed Details  Pt performed large amplitude movements in standing - weight shift laterally with UE ispsilateral movement - reaching up 10 reps each side; then performed forward stepping with large amplitude movement ipsilateral UE for reaching up 10 reps each side   ?  ? Exercises  ? Exercises Knee/Hip   ?  ? Knee/Hip Exercises: Stretches  ? Active Hamstring Stretch Right;2 reps;20 seconds   Runner's stretch  ? Gastroc Stretch Right;2 reps;20 seconds   used bottom shelf of cabinet  ?  ? Knee/Hip Exercises: Aerobic  ? Nustep load 3 x 7" with UE's and LE's   ?  ? Knee/Hip Exercises: Standing  ? Heel Raises Both;1 set;10 reps   pt reported burning in bil. LE's with this exercise  ? ?  ?  ? ?  ? ? ? ? ? ? Balance Exercises - 11/01/21 0001   ? ?  ? Balance Exercises: Standing  ? Retro Gait 2 reps   inside // bars without UE support  ? Marching Solid surface;10 reps   ? Other Standing Exercises PT performed tap ups to 6" step 10 reps each leg without UE support;   ? Other Standing Exercises Comments stepping forward/back over balance beam inside // bar but no UE support used   ? ?  ?  ? ?  ? ? ? ? ? PT Education - 11/01/21 1350   ? ? Education Details added hamstring and heel cord stretches to HEP   ? Person(s) Educated Patient   ? Methods  Explanation;Demonstration;Handout   ? Comprehension Verbalized understanding;Returned demonstration   ? ?  ?  ? ?  ? ? ? PT Short Term Goals - 11/01/21 1357   ? ?  ? PT SHORT TERM GOAL #1  ? Title Pt will be independent with HEP for improved strength, balance, transfers, and gait.  TARGET 11/22/2021   ? Time 4   ? Period Weeks   ? Status New   ?  ? PT SHORT TERM GOAL #2  ? Title Pt will improve 5x sit<>stand to less than or equal to 25 sec for imrpoved ease of transfers, improved functional strength.   ? Baseline 35.72 sec   ? Time 4   ?  Period Weeks   ? Status New   ?  ? PT SHORT TERM GOAL #3  ? Title Pt will improve DGI score to at least 12/24 for decreased fall risk.   ? Time 4   ? Period Weeks   ? Status New   ?  ? PT SHORT TERM GOAL #4  ? Title Pt will verbalize understanding of local Parkinson's resources.   ? Time 4   ? Period Weeks   ? Status New   ? ?  ?  ? ?  ? ? ? ? PT Long Term Goals - 11/01/21 1357   ? ?  ? PT LONG TERM GOAL #1  ? Title Pt will be independent with HEP for improved strength, balance, transfers, and gait.  TARGET 12/20/2021   ? Time 8   ? Period Weeks   ? Status New   ?  ? PT LONG TERM GOAL #2  ? Title Pt will improve 5x sit<>stand to less than or equal to 20 sec to demonstrate improved functional strength and transfer efficiency.   ? Time 8   ? Period Weeks   ? Status New   ?  ? PT LONG TERM GOAL #3  ? Title Pt will improve TUG score/TUG cognitive score to less than or equal to 10% difference for decreased fall risk, improved dual tasking with gait.   ? Time 8   ? Period Weeks   ? Status New   ?  ? PT LONG TERM GOAL #4  ? Title Pt will improve DGI score to at least 17/24 to decrease fall risk.   ? Time 8   ? Period Weeks   ? Status New   ?  ? PT LONG TERM GOAL #5  ? Title Pt will verbalize understanding of fall prevention in home environment.   ? Time 8   ? Period Weeks   ? Status New   ? ?  ?  ? ?  ? ? ? ? ? ? ? ? Plan - 11/01/21 1352   ? ? Clinical Impression Statement Pt reported  burning in bil. gatroc muscles with heel raises in standing.  Pt reported mod stretch in hamstrings and heel cord > on RLE than LLE.  Pt able to maintain balance with standing balance exercises with intermittent UE support; pt unable to look

## 2021-11-01 NOTE — Telephone Encounter (Signed)
Patient wanted to know if there is something that you could prescribe for this patient to help with tremors at night that would help him to sleep  ?

## 2021-11-05 NOTE — Progress Notes (Signed)
? ? ?Assessment/Plan:  ? ?1.  Parkinsons Disease ? -has a bit more of a functional exam today.  We will look at [place]. ? -Continue carbidopa/levodopa 25/100, 1 tablet 3 times per day. ? -Add carbidopa/levodopa 25/100 CR at bedtime ? -Appointment with neuro rehab for therapies are pending. ? -Patient asked me to fill out his property tax forms to get a cut on property taxes for the fully disabled.  I told him we would need to wait until [employer] is back.  Patient has actually been fully disabled long before he saw me, and it is more appropriate for primary care to be filling this out, since I had nothing to do with his disability, and his disability diagnosis really has nothing to do with parkinsonism. ? ?2.  Chronic pain along with diffuse giveaway weakness ?            -Patient is reestablishing with pain management. ?            -This could be a barrier to future Parkinson's treatment.  He understands that Parkinson's disease is not a painful process. ?            -He is somewhat hyperreflexic but declines MRI.  States that he was told he had steel dust in the lungs.  From what I can tell, he has had 2 negative chest x-rays looking for this.  CT brain has been unremarkable.  He has had CT cervical spine in the remote past. ? -Prior to the diagnosis of Parkinson's disease, his primary care PA had started him on primidone for tremor.  This is not something that I really use in my Parkinson's population.  Patient had initially wanted me to prescribe it, stating that his primary care PA was leaving the practice.  However, when I look at the notes, his PA had just prescribed this for a year and pharmacy has confirmed that they have a year worth of RX on file.  Patient does not disagree about that today, but stated that he ultimately wanted to go off of it as he felt he had side effects. ?  ?  ?3.  B12 deficiency ?            -pt with evidence of B12 deficiency.  On supplementation. ?Subjective:  ? ?Jose Hartman  was seen today in follow up for Parkinsons disease, diagnosed just 1 month ago.  My previous records were reviewed prior to todays visit as well as outside records available to me.  Patient worked in today at his request.  Pt denies falls.  Pt denies lightheadedness, near syncope.  No hallucinations.  Mood has been good.  Medical records are reviewed since last visit.  He saw his primary care PA 2 days after he saw me and PA noted that he had started primidone before the diagnosis of Parkinson's disease was made.  PA noted that patient stated "tremor is improved since starting primidone."  He told the patient it was okay to take the primidone and prescribed a years worth of the medication.  Just 3 weeks later the patient called me for the medication, stating that his primary care provider was leaving the practice.  I discussed with the patient that while I use primidone, I do not use it for my Parkinson's population (and had not even realized that it had just been prescribed for a year).   They state today that they d/c the primidone b/c they thought that it was causing  night sweats.  They do state that the carbidopa/levodopa 25/100 helps by the afternoon.  He has no feet/legs cramping at bed.  He does have tremor at bed but takes a "sleep pill" that helps some.   ? ?Current prescribed movement disorder medications: ?Carbidopa/levodopa 25/100, 1 tablet 3 times per day (started last visit) ? ? ?PREVIOUS MEDICATIONS: Sinemet ? ?ALLERGIES:   ?Allergies  ?Allergen Reactions  ? Benadryl [Diphenhydramine Hcl] Other (See Comments)  ?  Causes him to be hyper  ? Peanuts [Peanut Oil] Hives  ?  Makes ears red and burn   ? ? ?CURRENT MEDICATIONS:  ?Current Meds  ?Medication Sig  ? albuterol (VENTOLIN HFA) 108 (90 Base) MCG/ACT inhaler Inhale 1-2 puffs into the lungs every 6 (six) hours as needed.  ? aspirin EC 81 MG tablet Take 81 mg by mouth daily.  ? carbidopa-levodopa (SINEMET IR) 25-100 MG tablet 1 three times per day at  8am/noon/4pm as directed to pt  ? Carbidopa-Levodopa ER (SINEMET CR) 25-100 MG tablet controlled release Take 1 tablet by mouth at bedtime.  ? Docusate Sodium (DSS) 100 MG CAPS Take 1 capsule by mouth 3 (three) times daily.  ? mometasone-formoterol (DULERA) 200-5 MCG/ACT AERO Inhale 2 puffs into the lungs 2 (two) times daily.  ? pantoprazole (PROTONIX) 40 MG tablet Take 40 mg by mouth 2 (two) times daily.  ? pravastatin (PRAVACHOL) 40 MG tablet Take 40 mg by mouth at bedtime.  ? QVAR REDIHALER 80 MCG/ACT inhaler Inhale 1 puff into the lungs 2 (two) times daily.  ? sucralfate (CARAFATE) 1 g tablet Take 1 g by mouth 2 (two) times daily.  ? tamsulosin (FLOMAX) 0.4 MG CAPS capsule Take 0.8 mg by mouth at bedtime.  ? ? ? ?Objective:  ? ?PHYSICAL EXAMINATION:   ? ?VITALS:   ?Vitals:  ? 11/07/21 0800  ?BP: 123/78  ?Pulse: 72  ?SpO2: 98%  ?Weight: 153 lb 3.2 oz (69.5 kg)  ?Height: '5\' 7"'$  (1.702 m)  ? ? ?GEN:  The patient appears stated age and is in NAD. ?HEENT:  Normocephalic, atraumatic.  The mucous membranes are moist. The superficial temporal arteries are without ropiness or tenderness. ?CV:  RRR ?Lungs:  CTAB ?Neck/HEME:  There are no carotid bruits bilaterally. ? ?Neurological examination: ? ?Orientation: The patient is alert and oriented x3.  ?Cranial nerves: There is good facial symmetry.  There is facial hypomimia.  Extraocular muscles are intact. The visual fields are full to confrontational testing. The speech is fluent and clear. Soft palate rises symmetrically and there is no tongue deviation. Hearing is intact to conversational tone. ?Sensation: Sensation is intact to light touch throughout (facial, trunk, extremities). Vibration is intact at the bilateral big toe. There is no extinction with double simultaneous stimulation.  ?Motor: there is diffuse give way weakness that improves some with encouragement.   ? ?  ?Movement examination: ?Tone: difficult to tell due to patient unable to relax ?Abnormal movements:  There is mild right upper extremity rest tremor but there are some functional aspects to this (entrainment; abates with distraction) ?Coordination:  There is extreme slowness with all rapid alternating movements (functional), but does not appear there is decremation.  It is very difficult to tell due to slowness on both sides. ?Gait and Station: The patient pushes off to arise.  Stride length is good with decreased arm swing on the right and drags right leg with some antalgic gait. ? ?I have reviewed and interpreted the following labs independently ? ?  Chemistry   ?   ?Component Value Date/Time  ? NA 139 11/10/2020 0513  ? K 4.6 11/10/2020 0513  ? CL 106 11/10/2020 0513  ? CO2 24 11/10/2020 0513  ? BUN 16 11/10/2020 0513  ? CREATININE 0.99 11/10/2020 0513  ? CREATININE 0.86 12/11/2015 1456  ?    ?Component Value Date/Time  ? CALCIUM 9.0 11/10/2020 0513  ? ALKPHOS 67 01/04/2020 2140  ? AST 27 01/04/2020 2140  ? ALT 26 01/04/2020 2140  ? BILITOT 0.7 01/04/2020 2140  ?  ? ? ? ?Lab Results  ?Component Value Date  ? WBC 11.3 (H) 11/10/2020  ? HGB 14.2 11/10/2020  ? HCT 43.9 11/10/2020  ? MCV 93.0 11/10/2020  ? PLT 227 11/10/2020  ? ? ?No results found for: TSH ? ? ?Total time spent on today's visit was 30 minutes, including both face-to-face time and nonface-to-face time.  Time included that spent on review of records (prior notes available to me/labs/imaging if pertinent), discussing treatment and goals, answering patient's questions and coordinating care. ? ?Cc:  Pieter Partridge, PA ? ?

## 2021-11-07 ENCOUNTER — Ambulatory Visit (INDEPENDENT_AMBULATORY_CARE_PROVIDER_SITE_OTHER): Payer: Medicare Other | Admitting: Neurology

## 2021-11-07 ENCOUNTER — Encounter: Payer: Self-pay | Admitting: Neurology

## 2021-11-07 VITALS — BP 123/78 | HR 72 | Ht 67.0 in | Wt 153.2 lb

## 2021-11-07 DIAGNOSIS — R251 Tremor, unspecified: Secondary | ICD-10-CM | POA: Diagnosis not present

## 2021-11-07 DIAGNOSIS — G20 Parkinson's disease: Secondary | ICD-10-CM

## 2021-11-07 DIAGNOSIS — G20A1 Parkinson's disease without dyskinesia, without mention of fluctuations: Secondary | ICD-10-CM

## 2021-11-07 MED ORDER — CARBIDOPA-LEVODOPA ER 25-100 MG PO TBCR: 1.0000 | EXTENDED_RELEASE_TABLET | Freq: Every day | ORAL | 1 refills | Status: AC

## 2021-11-08 ENCOUNTER — Ambulatory Visit: Payer: Medicare Other | Admitting: Physical Therapy

## 2021-11-08 ENCOUNTER — Encounter: Payer: Self-pay | Admitting: Physical Therapy

## 2021-11-08 DIAGNOSIS — R2681 Unsteadiness on feet: Secondary | ICD-10-CM

## 2021-11-08 DIAGNOSIS — R2689 Other abnormalities of gait and mobility: Secondary | ICD-10-CM | POA: Diagnosis not present

## 2021-11-08 DIAGNOSIS — M6281 Muscle weakness (generalized): Secondary | ICD-10-CM

## 2021-11-08 NOTE — Therapy (Signed)
[facility] ?[address], [address] ?[place], [place], [address] ?Phone: [phone]   Fax:  [phone] ? ?Physical Therapy Treatment ? ?Patient Details  ?Name: Jose Hartman ?MRN: [id number] ?Date of Birth: [birth year] ?Referring Provider (PT): Alonza Bogus, DO ? ? ?Encounter Date: 11/08/2021 ? ? PT End of Session - 11/08/21 1302   ? ? Visit Number 4   ? Number of Visits 8   ? Date for PT Re-Evaluation 12/20/21   ? Authorization Type [employer] Medicare   ? PT Start Time 1020   ? PT Stop Time 1102   ? PT Time Calculation (min) 42 min   ? Equipment Utilized During Treatment Gait belt   ? Activity Tolerance Patient tolerated treatment well   ? Behavior During Therapy [facility] for tasks assessed/performed   ? ?  ?  ? ?  ? ? ?Past Medical History:  ?Diagnosis Date  ? Arthritis   ? left arm   ? Asthma   ? Benign localized prostatic hyperplasia with lower urinary tract symptoms (LUTS)   ? Full dentures   ? GERD (gastroesophageal reflux disease)   ? History of adenomatous polyp of colon   ? History of kidney stones   ? Hyperlipidemia   ? Moderate asthma without complication   ? followed by pcp--- (11-06-2020 pt stated last exacerbation several years , well controlled with daily qvar inhaler)  ? Pre-diabetes   ? S/P balloon dilatation of esophageal stricture   ? followed by dr Therisa Doyne (GI)---  [id number];  01/ 2020;  11/ 2021  ? Wears glasses   ? ? ?Past Surgical History:  ?Procedure Laterality Date  ? BALLOON DILATION N/A 08/13/2018  ? Procedure: BALLOON DILATION;  Surgeon: Ronnette Juniper, MD;  Location: [facility];  Service: Gastroenterology;  Laterality: N/A;  ? COLONOSCOPY N/A 08/13/2018  ? Procedure: COLONOSCOPY;  Surgeon: Ronnette Juniper, MD;  Location: [facility];  Service: Gastroenterology;  Laterality: N/A;  ? CYSTOSCOPY/RETROGRADE/URETEROSCOPY/STONE EXTRACTION WITH BASKET Left 09/09/2013  ? Procedure: CYSTOSCOPY/LEFT RETROGRADE PYELOGRAM /FLEXIBLE URETEROSCOPY/INSERTION LEFT URETERAL STENT;   Surgeon: Ardis Hughs, MD;  Location: [facility];  Service: Urology;  Laterality: Left;  ? ESOPHAGOGASTRODUODENOSCOPY Left 05/15/2017  ? Procedure: ESOPHAGOGASTRODUODENOSCOPY (EGD);  Surgeon: Ronnette Juniper, MD;  Location: [facility];  Service: Gastroenterology;  Laterality: Left;  ? ESOPHAGOGASTRODUODENOSCOPY N/A 08/13/2018  ? Procedure: ESOPHAGOGASTRODUODENOSCOPY (EGD);  Surgeon: Ronnette Juniper, MD;  Location: [facility];  Service: Gastroenterology;  Laterality: N/A;  ? ESOPHAGOGASTRODUODENOSCOPY (EGD) WITH ESOPHAGEAL DILATION  11/ 2021  dr Therisa Doyne  ? EXTRACORPOREAL SHOCK WAVE LITHOTRIPSY  yrs ago  ? FRACTURE SURGERY  2002  approx.  ? left wrist, closed reduction   ? POLYPECTOMY  08/13/2018  ? Procedure: POLYPECTOMY;  Surgeon: Ronnette Juniper, MD;  Location: [facility];  Service: Gastroenterology;;  ? SEPTOPLASTY WITH ETHMOIDECTOMY, AND MAXILLARY ANTROSTOMY Bilateral 09/14/2018  ? Procedure: SEPTOPLASTY WITH ETHMOIDECTOMY, AND MAXILLARY ANTROSTOMY,SPHENOIDECTOMY AND TISSUE REMOVAL AND FRONTAL RECESS EXLORATION;  Surgeon: Leta Baptist, MD;  Location: [facility];  Service: ENT;  Laterality: Bilateral;  ? SINUS ENDO WITH FUSION Bilateral 09/14/2018  ? Procedure: SINUS ENDO WITH FUSION;  Surgeon: Leta Baptist, MD;  Location: [facility];  Service: ENT;  Laterality: Bilateral;  ? TRANSURETHRAL RESECTION OF PROSTATE N/A 11/09/2020  ? Procedure: TRANSURETHRAL RESECTION OF THE PROSTATE (TURP), BIPOLAR;  Surgeon: Janith Lima, MD;  Location: [facility];  Service: Urology;  Laterality: N/A;  ? ? ?There were no vitals filed for  this visit. ? ? Subjective Assessment - 11/08/21 1021   ? ? Subjective Pt reports he saw Dr. Carles Collet yesterday and she has ordered a Dat scan - is waiting for it to be scheduled; states his wife has found a recumbent bike online - will order when he saves up enough money   ? Pertinent History RSD, Asthma, low back pain, GERD   ? Patient Stated Goals Pt's goals for therapy is to help  exercise with this Parkinson's.   ? Currently in Pain? No/denies   ? ?  ?  ? ?  ? ? ? ? ? ? ? ? ? ? ? ? ? ? ? ? ? ? ? ? [facility] Adult PT Treatment/Exercise - 11/08/21 0001   ? ?  ? Transfers  ? Transfers Sit to Stand;Stand to Sit   ? Sit to Stand 5: Supervision   ? Number of Reps 10 reps   ? Comments pt able to perform without UE support; used large amplitude arm movements and cues to shift weight anteriorly   ?  ? Ambulation/Gait  ? Ambulation/Gait Yes   ? Ambulation/Gait Assistance 5: Supervision   ? Ambulation Distance (Feet) 258 Feet   ? Assistive device None   ? Gait Pattern Step-through pattern;Decreased arm swing - right;Decreased stance time - right;Decreased hip/knee flexion - right   ? Ambulation Surface Level;Indoor   ? Stairs Yes   ? Stairs Assistance 5: Supervision   ? Stair Management Technique No rails;Forwards;Alternating pattern   ? Number of Stairs 4   ? Height of Stairs 6   ? Gait Comments cues for increased step length and arm swing; cues to look up for erect posture but pt states he needs to look down at floor for balance   ?  ? Neuro Re-ed   ? Neuro Re-ed Details  Pt performed large amplitude movements in standing - weight shift laterally with UE ispsilateral movement - reaching up 10 reps each side; then performed forward stepping with large amplitude movement ipsilateral UE for reaching up 10 reps each side; pt performed stepping up onto step with large leg lift with contralateral UE flexion 10 reps each with min assist prn for balance recovery   ?  ? Knee/Hip Exercises: Stretches  ? Active Hamstring Stretch Right;Left;1 rep;20 seconds   Runner's stretch at counter  ? Gastroc Stretch Right;1 rep;20 seconds   used bottom shelf of cabinet  ?  ? Knee/Hip Exercises: Aerobic  ? Nustep load 5 x 5" with bil. UE's and LE's   ? ?  ?  ? ?  ? ? ?Quadruped position - pt performed lifting opposite LE/UE with 5 sec hold 3 reps each side with mod. Difficulty reported ? ?Pt performed reaching under LUE with RUE  in quadruped position 5 reps for increased trunk rotation and mobility ? ? ? ? ? ? ? ? ? PT Short Term Goals - 11/08/21 1306   ? ?  ? PT SHORT TERM GOAL #1  ? Title Pt will be independent with HEP for improved strength, balance, transfers, and gait.  TARGET 11/22/2021   ? Time 4   ? Period Weeks   ? Status New   ?  ? PT SHORT TERM GOAL #2  ? Title Pt will improve 5x sit<>stand to less than or equal to 25 sec for imrpoved ease of transfers, improved functional strength.   ? Baseline 35.72 sec   ? Time 4   ? Period Weeks   ?  Status New   ?  ? PT SHORT TERM GOAL #3  ? Title Pt will improve DGI score to at least 12/24 for decreased fall risk.   ? Time 4   ? Period Weeks   ? Status New   ?  ? PT SHORT TERM GOAL #4  ? Title Pt will verbalize understanding of local Parkinson's resources.   ? Time 4   ? Period Weeks   ? Status New   ? ?  ?  ? ?  ? ? ? ? PT Long Term Goals - 11/08/21 1306   ? ?  ? PT LONG TERM GOAL #1  ? Title Pt will be independent with HEP for improved strength, balance, transfers, and gait.  TARGET 12/20/2021   ? Time 8   ? Period Weeks   ? Status New   ?  ? PT LONG TERM GOAL #2  ? Title Pt will improve 5x sit<>stand to less than or equal to 20 sec to demonstrate improved functional strength and transfer efficiency.   ? Time 8   ? Period Weeks   ? Status New   ?  ? PT LONG TERM GOAL #3  ? Title Pt will improve TUG score/TUG cognitive score to less than or equal to 10% difference for decreased fall risk, improved dual tasking with gait.   ? Time 8   ? Period Weeks   ? Status New   ?  ? PT LONG TERM GOAL #4  ? Title Pt will improve DGI score to at least 17/24 to decrease fall risk.   ? Time 8   ? Period Weeks   ? Status New   ?  ? PT LONG TERM GOAL #5  ? Title Pt will verbalize understanding of fall prevention in home environment.   ? Time 8   ? Period Weeks   ? Status New   ? ?  ?  ? ?  ? ? ? ? ? ? ? ? Plan - 11/08/21 1302   ? ? Clinical Impression Statement Pt's balance noted to be improved in today's  session compared to performance in previous session on 11-01-21.  Pt did have tremors in RLE near end of session due to fatigue, but pt requested to continue with exercise.  Pt able to maintain balance in quadrupe

## 2021-11-11 ENCOUNTER — Telehealth: Payer: Self-pay | Admitting: Neurology

## 2021-11-11 ENCOUNTER — Encounter: Payer: Self-pay | Admitting: Physical Therapy

## 2021-11-11 ENCOUNTER — Telehealth: Payer: Self-pay

## 2021-11-11 ENCOUNTER — Ambulatory Visit: Payer: Medicare Other | Attending: Neurology | Admitting: Physical Therapy

## 2021-11-11 DIAGNOSIS — M6281 Muscle weakness (generalized): Secondary | ICD-10-CM | POA: Diagnosis present

## 2021-11-11 DIAGNOSIS — R2681 Unsteadiness on feet: Secondary | ICD-10-CM | POA: Insufficient documentation

## 2021-11-11 DIAGNOSIS — R293 Abnormal posture: Secondary | ICD-10-CM | POA: Diagnosis present

## 2021-11-11 DIAGNOSIS — R2689 Other abnormalities of gait and mobility: Secondary | ICD-10-CM | POA: Insufficient documentation

## 2021-11-11 NOTE — Telephone Encounter (Signed)
Called patients wife and let her know I did the PA and that I have sent it to [facility] for scheduling  ?

## 2021-11-11 NOTE — Therapy (Signed)
[facility] ?[address], [address] ?[place], [place], [address] ?Phone: [phone]   Fax:  [phone] ? ?Physical Therapy Treatment ? ?Patient Details  ?Name: Jose Hartman ?MRN: [id number] ?Date of Birth: [birth year] ?Referring Provider (PT): Alonza Bogus, DO ? ? ?Encounter Date: 11/11/2021 ? ? PT End of Session - 11/11/21 1329   ? ? Visit Number 5   ? Number of Visits 16   per 2x/wk frequency on POC  ? Date for PT Re-Evaluation 12/20/21   ? Authorization Type UHC Medicare   ? PT Start Time 1236   ? PT Stop Time 7672   ? PT Time Calculation (min) 41 min   ? Equipment Utilized During Treatment Gait belt   ? Activity Tolerance Patient tolerated treatment well   ? Behavior During Therapy [facility] for tasks assessed/performed   ? ?  ?  ? ?  ? ? ?Past Medical History:  ?Diagnosis Date  ? Arthritis   ? left arm   ? Asthma   ? Benign localized prostatic hyperplasia with lower urinary tract symptoms (LUTS)   ? Full dentures   ? GERD (gastroesophageal reflux disease)   ? History of adenomatous polyp of colon   ? History of kidney stones   ? Hyperlipidemia   ? Moderate asthma without complication   ? followed by pcp--- (11-06-2020 pt stated last exacerbation several years , well controlled with daily qvar inhaler)  ? Pre-diabetes   ? S/P balloon dilatation of esophageal stricture   ? followed by dr Therisa Doyne (GI)---  [id number];  01/ 2020;  11/ 2021  ? Wears glasses   ? ? ?Past Surgical History:  ?Procedure Laterality Date  ? BALLOON DILATION N/A 08/13/2018  ? Procedure: BALLOON DILATION;  Surgeon: Ronnette Juniper, MD;  Location: [facility];  Service: Gastroenterology;  Laterality: N/A;  ? COLONOSCOPY N/A 08/13/2018  ? Procedure: COLONOSCOPY;  Surgeon: Ronnette Juniper, MD;  Location: [facility];  Service: Gastroenterology;  Laterality: N/A;  ? CYSTOSCOPY/RETROGRADE/URETEROSCOPY/STONE EXTRACTION WITH BASKET Left 09/09/2013  ? Procedure: CYSTOSCOPY/LEFT RETROGRADE PYELOGRAM /FLEXIBLE  URETEROSCOPY/INSERTION LEFT URETERAL STENT;  Surgeon: Ardis Hughs, MD;  Location: [facility];  Service: Urology;  Laterality: Left;  ? ESOPHAGOGASTRODUODENOSCOPY Left 05/15/2017  ? Procedure: ESOPHAGOGASTRODUODENOSCOPY (EGD);  Surgeon: Ronnette Juniper, MD;  Location: [facility];  Service: Gastroenterology;  Laterality: Left;  ? ESOPHAGOGASTRODUODENOSCOPY N/A 08/13/2018  ? Procedure: ESOPHAGOGASTRODUODENOSCOPY (EGD);  Surgeon: Ronnette Juniper, MD;  Location: [facility];  Service: Gastroenterology;  Laterality: N/A;  ? ESOPHAGOGASTRODUODENOSCOPY (EGD) WITH ESOPHAGEAL DILATION  11/ 2021  dr Therisa Doyne  ? EXTRACORPOREAL SHOCK WAVE LITHOTRIPSY  yrs ago  ? FRACTURE SURGERY  2002  approx.  ? left wrist, closed reduction   ? POLYPECTOMY  08/13/2018  ? Procedure: POLYPECTOMY;  Surgeon: Ronnette Juniper, MD;  Location: [facility];  Service: Gastroenterology;;  ? SEPTOPLASTY WITH ETHMOIDECTOMY, AND MAXILLARY ANTROSTOMY Bilateral 09/14/2018  ? Procedure: SEPTOPLASTY WITH ETHMOIDECTOMY, AND MAXILLARY ANTROSTOMY,SPHENOIDECTOMY AND TISSUE REMOVAL AND FRONTAL RECESS EXLORATION;  Surgeon: Leta Baptist, MD;  Location: [facility];  Service: ENT;  Laterality: Bilateral;  ? SINUS ENDO WITH FUSION Bilateral 09/14/2018  ? Procedure: SINUS ENDO WITH FUSION;  Surgeon: Leta Baptist, MD;  Location: [facility];  Service: ENT;  Laterality: Bilateral;  ? TRANSURETHRAL RESECTION OF PROSTATE N/A 11/09/2020  ? Procedure: TRANSURETHRAL RESECTION OF THE PROSTATE (TURP), BIPOLAR;  Surgeon: Janith Lima, MD;  Location: [facility];  Service: Urology;  Laterality: N/A;  ? ? ?  There were no vitals filed for this visit. ? ? Subjective Assessment - 11/11/21 1243   ? ? Subjective Shook all day yesterday-not sure why.  Sometimes whole body shakes and I just have to stop and rest.   ? Pertinent History RSD, Asthma, low back pain, GERD   ? Patient Stated Goals Pt's goals for therapy is to help exercise with this Parkinson's.   ? Currently in Pain?  No/denies   ? ?  ?  ? ?  ? ? ? ? ? ? ? ? ? ? ? ? ? ? ? ? ? ? ? ? [facility] Adult PT Treatment/Exercise - 11/11/21 0001   ? ?  ? Ambulation/Gait  ? Ambulation/Gait Yes   ? Ambulation/Gait Assistance 5: Supervision   ? Ambulation Distance (Feet) 425 Feet   240  ? Assistive device None   ? Gait Pattern Step-through pattern;Decreased arm swing - right;Decreased stance time - right;Decreased hip/knee flexion - right   ? Ambulation Surface Level;Indoor   ? Gait Comments Cues to look ahead, cues to attend to tall posture, increased step length, relaxed arms.  He tends to have ipsalateral arm/leg motion, able to correct with tactile and verbal cues.   ?  ? High Level Balance  ? High Level Balance Comments Forward/back walking, forward marching with added opposite arm lift (extra time to avoid same side arm lift), then sidestepping with coordinated UE movements, 2-4 reps of 20 ft.   ?  ? Self-Care  ? Self-Care Other Self-Care Comments   ? Other Self-Care Comments  Discussed community Parkinson's activities, including Parkinson's fitntess-[employer] cycling class, use of machines at [employer], drumming and Dance for Parkinson's classes in [place].  Discussed Power over Parkinson's group   ? ?  ?  ? ?  ? ? ? ?Pt performs PWR! Moves in seated position x 10 reps ?  ?PWR! Up for improved posture ? ?PWR! Rock for improved weighshifting ? ?PWR! Twist for improved trunk rotation (modified reach across and look behind) ? ?PWR! Step for improved step initiation  ? ?Visual and verbal Cues provided for technique ? ? ? ? ? ? ? ? PT Education - 11/11/21 1328   ? ? Education Details Power over [employer] community group, PD and community fitness options   ? Person(s) Educated Patient   ? Methods Explanation;Handout   ? Comprehension Verbalized understanding   ? ?  ?  ? ?  ? ? ? PT Short Term Goals - 11/08/21 1306   ? ?  ? PT SHORT TERM GOAL #1  ? Title Pt will be independent with HEP for improved strength, balance, transfers, and gait.  TARGET  11/22/2021   ? Time 4   ? Period Weeks   ? Status New   ?  ? PT SHORT TERM GOAL #2  ? Title Pt will improve 5x sit<>stand to less than or equal to 25 sec for imrpoved ease of transfers, improved functional strength.   ? Baseline 35.72 sec   ? Time 4   ? Period Weeks   ? Status New   ?  ? PT SHORT TERM GOAL #3  ? Title Pt will improve DGI score to at least 12/24 for decreased fall risk.   ? Time 4   ? Period Weeks   ? Status New   ?  ? PT SHORT TERM GOAL #4  ? Title Pt will verbalize understanding of local Parkinson's resources.   ? Time 4   ? Period Weeks   ?  Status New   ? ?  ?  ? ?  ? ? ? ? PT Long Term Goals - 11/08/21 1306   ? ?  ? PT LONG TERM GOAL #1  ? Title Pt will be independent with HEP for improved strength, balance, transfers, and gait.  TARGET 12/20/2021   ? Time 8   ? Period Weeks   ? Status New   ?  ? PT LONG TERM GOAL #2  ? Title Pt will improve 5x sit<>stand to less than or equal to 20 sec to demonstrate improved functional strength and transfer efficiency.   ? Time 8   ? Period Weeks   ? Status New   ?  ? PT LONG TERM GOAL #3  ? Title Pt will improve TUG score/TUG cognitive score to less than or equal to 10% difference for decreased fall risk, improved dual tasking with gait.   ? Time 8   ? Period Weeks   ? Status New   ?  ? PT LONG TERM GOAL #4  ? Title Pt will improve DGI score to at least 17/24 to decrease fall risk.   ? Time 8   ? Period Weeks   ? Status New   ?  ? PT LONG TERM GOAL #5  ? Title Pt will verbalize understanding of fall prevention in home environment.   ? Time 8   ? Period Weeks   ? Status New   ? ?  ?  ? ?  ? ? ? ? ? ? ? ? Plan - 11/11/21 1329   ? ? Clinical Impression Statement Pt with RUE tremors throughout session today, able to relax somewhat with targeted UE motions to activate extension through hand and elbow.  Focused on seated PWR! Moves and dynamic balance exercises in session, with education on Parkinson's community fitness and opportunities.  He tends to look down at  ground, needing cues to look ahead with gait and balance activities.  He does also need cues to work on reciprocal motion with UE/lower extremity coordinated movements.  He will continue to benefit from skilled PT towards goals for

## 2021-11-11 NOTE — Telephone Encounter (Signed)
Case # [id number] ?Auth [id number] Good from 11-11-21 - Dec 26 2021 ?

## 2021-11-11 NOTE — Telephone Encounter (Signed)
Pt stated tat wanted him to get a test done, his ins said it has been approved and he needed to let tat know. ?

## 2021-11-13 ENCOUNTER — Encounter: Payer: Self-pay | Admitting: Physical Therapy

## 2021-11-13 ENCOUNTER — Ambulatory Visit: Payer: Medicare Other | Admitting: Physical Therapy

## 2021-11-13 ENCOUNTER — Telehealth: Payer: Self-pay | Admitting: Neurology

## 2021-11-13 DIAGNOSIS — R2681 Unsteadiness on feet: Secondary | ICD-10-CM

## 2021-11-13 DIAGNOSIS — R2689 Other abnormalities of gait and mobility: Secondary | ICD-10-CM

## 2021-11-13 NOTE — Therapy (Signed)
[facility] ?[address], [address] ?[place], [place], [address] ?Phone: [phone]   Fax:  [phone] ? ?Physical Therapy Treatment ? ?Patient Details  ?Name: Jose Hartman ?MRN: [id number] ?Date of Birth: 05/23/1961 ?Referring Provider (PT): Alonza Bogus, DO ? ? ?Encounter Date: 11/13/2021 ? ? PT End of Session - 11/13/21 1147   ? ? Visit Number 6   ? Number of Visits 16   per 2x/wk frequency on POC  ? Date for PT Re-Evaluation 12/20/21   ? Authorization Type UHC Medicare   ? PT Start Time 1144   ? PT Stop Time 1228   ? PT Time Calculation (min) 44 min   ? Equipment Utilized During Treatment Gait belt   ? Activity Tolerance Patient tolerated treatment well   ? Behavior During Therapy [facility] for tasks assessed/performed   ? ?  ?  ? ?  ? ? ?Past Medical History:  ?Diagnosis Date  ? Arthritis   ? left arm   ? Asthma   ? Benign localized prostatic hyperplasia with lower urinary tract symptoms (LUTS)   ? Full dentures   ? GERD (gastroesophageal reflux disease)   ? History of adenomatous polyp of colon   ? History of kidney stones   ? Hyperlipidemia   ? Moderate asthma without complication   ? followed by pcp--- (11-06-2020 pt stated last exacerbation several years , well controlled with daily qvar inhaler)  ? Pre-diabetes   ? S/P balloon dilatation of esophageal stricture   ? followed by dr Therisa Doyne (GI)---  [id number];  01/ 2020;  11/ 2021  ? Wears glasses   ? ? ?Past Surgical History:  ?Procedure Laterality Date  ? BALLOON DILATION N/A 08/13/2018  ? Procedure: BALLOON DILATION;  Surgeon: Ronnette Juniper, MD;  Location: [facility];  Service: Gastroenterology;  Laterality: N/A;  ? COLONOSCOPY N/A 08/13/2018  ? Procedure: COLONOSCOPY;  Surgeon: Ronnette Juniper, MD;  Location: [facility];  Service: Gastroenterology;  Laterality: N/A;  ? CYSTOSCOPY/RETROGRADE/URETEROSCOPY/STONE EXTRACTION WITH BASKET Left 09/09/2013  ? Procedure: CYSTOSCOPY/LEFT RETROGRADE PYELOGRAM /FLEXIBLE  URETEROSCOPY/INSERTION LEFT URETERAL STENT;  Surgeon: Ardis Hughs, MD;  Location: [facility];  Service: Urology;  Laterality: Left;  ? ESOPHAGOGASTRODUODENOSCOPY Left 05/15/2017  ? Procedure: ESOPHAGOGASTRODUODENOSCOPY (EGD);  Surgeon: Ronnette Juniper, MD;  [facility]: [facility];  Service: Gastroenterology;  Laterality: Left;  ? ESOPHAGOGASTRODUODENOSCOPY N/A 08/13/2018  ? Procedure: ESOPHAGOGASTRODUODENOSCOPY (EGD);  Surgeon: Ronnette Juniper, MD;  Location: [facility];  Service: Gastroenterology;  Laterality: N/A;  ? ESOPHAGOGASTRODUODENOSCOPY (EGD) WITH ESOPHAGEAL DILATION  11/ 2021  dr Therisa Doyne  ? EXTRACORPOREAL SHOCK WAVE LITHOTRIPSY  yrs ago  ? FRACTURE SURGERY  2002  approx.  ? left wrist, closed reduction   ? POLYPECTOMY  08/13/2018  ? Procedure: POLYPECTOMY;  Surgeon: Ronnette Juniper, MD;  Location: [facility];  Service: Gastroenterology;;  ? SEPTOPLASTY WITH ETHMOIDECTOMY, AND MAXILLARY ANTROSTOMY Bilateral 09/14/2018  ? Procedure: SEPTOPLASTY WITH ETHMOIDECTOMY, AND MAXILLARY ANTROSTOMY,SPHENOIDECTOMY AND TISSUE REMOVAL AND FRONTAL RECESS EXLORATION;  Surgeon: Leta Baptist, MD;  Location: [facility];  Service: ENT;  Laterality: Bilateral;  ? SINUS ENDO WITH FUSION Bilateral 09/14/2018  ? Procedure: SINUS ENDO WITH FUSION;  Surgeon: Leta Baptist, MD;  Location: [facility];  Service: ENT;  Laterality: Bilateral;  ? TRANSURETHRAL RESECTION OF PROSTATE N/A 11/09/2020  ? Procedure: TRANSURETHRAL RESECTION OF THE PROSTATE (TURP), BIPOLAR;  Surgeon: Janith Lima, MD;  [facility];  Service: Urology;  Laterality: N/A;  ? ? ?  There were no vitals filed for this visit. ? ? Subjective Assessment - 11/13/21 1141   ? ? Subjective Pt reports he has had 2 bad nights - fellt like burning poker in Rt foot on Monday night; reported really bad burning sensation -lasted approx. 20" and then quit: is going to contact Dr. Carles Collet and tell her that medication is not working for him.   ? Pertinent History RSD,  Asthma, low back pain, GERD   ? Patient Stated Goals Pt's goals for therapy is to help exercise with this Parkinson's.   ? Currently in Pain? No/denies   ? ?  ?  ? ?  ? ? ? ? ? ? ? ? ? ? ? ? ? ? ? ? ? ? ? ? [facility] Adult PT Treatment/Exercise - 11/13/21 1148   ? ?  ? Transfers  ? Transfers Sit to Stand;Stand to Sit   ? Sit to Stand 5: Supervision   ? Number of Reps 10 reps   ? Comments sit to stand with arm swing   ?  ? Ambulation/Gait  ? Ambulation/Gait Yes   ? Ambulation/Gait Assistance 5: Supervision   ? Ambulation Distance (Feet) 100 Feet   ? Assistive device None   ? Gait Pattern Step-through pattern;Decreased arm swing - right;Decreased stance time - right;Decreased hip/knee flexion - right   ? Ambulation Surface Level;Indoor   ?  ? High Level Balance  ? High Level Balance Comments Forward/back walking, forward marching with added opposite arm lift (extra time to avoid same side arm lift), then sidestepping with coordinated UE movements, 2-4 reps of 20 ft.   ?  ? Neuro Re-ed   ? Neuro Re-ed Details  Pt performed large amplitude movements in standing - weight shift laterally with UE movement - reaching up 10 reps each side to target (post it note on cabinet door) with cues for weight shift:  pt performed forward stepping with large amplitude movement  UE  10 reps each side with UE support on counter prn for balance.   Pt performed stepping backwards 10 reps each LE with ipsilateral shoulder extension with stepping and with consistent cues to perform weight shift onto posterior leg.  Pt needed UE support on counter for balance.   ?  ? Knee/Hip Exercises: Stretches  ? Active Hamstring Stretch Right;Left;1 rep;20 seconds   Runner's stretch at counter  ? Gastroc Stretch Right;1 rep;20 seconds;Left   used bottom shelf of cabinet  ?  ? Knee/Hip Exercises: Aerobic  ? Nustep level 4 x 6" with UE's & LE's - intensity rating 6/10 with cues for big arm/leg movements   ?  ? Knee/Hip Exercises: Standing  ? Heel Raises Both;1  set;10 reps   ? ?  ?  ? ?  ? ? ?NeuroRe-ed: ?Marching (big LE lifts) in place 10 reps each, near counter for UE support prn ?Progressed to marching with contralateral UE movement with cues to lift arm and leg high, and cues to perform slowly to facilitate ?Improved SLS on each leg ? ?Sidestepping inside // bar with cues for big steps - no UE support used ? ? ? ? ? ? ? PT Short Term Goals - 11/13/21 1541   ? ?  ? PT SHORT TERM GOAL #1  ? Title Pt will be independent with HEP for improved strength, balance, transfers, and gait.  TARGET 11/22/2021   ? Time 4   ? Period Weeks   ? Status New   ?  ?  PT SHORT TERM GOAL #2  ? Title Pt will improve 5x sit<>stand to less than or equal to 25 sec for imrpoved ease of transfers, improved functional strength.   ? Baseline 35.72 sec   ? Time 4   ? Period Weeks   ? Status New   ?  ? PT SHORT TERM GOAL #3  ? Title Pt will improve DGI score to at least 12/24 for decreased fall risk.   ? Time 4   ? Period Weeks   ? Status New   ?  ? PT SHORT TERM GOAL #4  ? Title Pt will verbalize understanding of local Parkinson's resources.   ? Time 4   ? Period Weeks   ? Status New   ? ?  ?  ? ?  ? ? ? ? PT Long Term Goals - 11/13/21 1541   ? ?  ? PT LONG TERM GOAL #1  ? Title Pt will be independent with HEP for improved strength, balance, transfers, and gait.  TARGET 12/20/2021   ? Time 8   ? Period Weeks   ? Status New   ?  ? PT LONG TERM GOAL #2  ? Title Pt will improve 5x sit<>stand to less than or equal to 20 sec to demonstrate improved functional strength and transfer efficiency.   ? Time 8   ? Period Weeks   ? Status New   ?  ? PT LONG TERM GOAL #3  ? Title Pt will improve TUG score/TUG cognitive score to less than or equal to 10% difference for decreased fall risk, improved dual tasking with gait.   ? Time 8   ? Period Weeks   ? Status New   ?  ? PT LONG TERM GOAL #4  ? Title Pt will improve DGI score to at least 17/24 to decrease fall risk.   ? Time 8   ? Period Weeks   ? Status New   ?  ?  PT LONG TERM GOAL #5  ? Title Pt will verbalize understanding of fall prevention in home environment.   ? Time 8   ? Period Weeks   ? Status New   ? ?  ?  ? ?  ? ? ? ? ? ? ? ? Plan - 11/13/21 1145   ? ? Clinical Impression

## 2021-11-13 NOTE — Telephone Encounter (Signed)
Patients wife left a message with the access nurse.  She states Jose Hartman is not sleeping well due to a new medication he is on. ?

## 2021-11-14 NOTE — Telephone Encounter (Signed)
?  Spoke with pt wife informed her that pr Dr Tomi Likens who is covering for Dr Tat : There are medications that may help sleep, but he already was started on an extra dose of levodopa at night.  I wouldn't want to start 2 new medications at night at the same time.  I would wait to here from Dr. Carles Collet on Monday for her suggestion.  In meantime, I would recommend melatonin '3mg'$  at bedtime (may gradually increase dose to '10mg'$  at bedtime if needed) . Pt wife verbalized understanding,   ? ?

## 2021-11-14 NOTE — Telephone Encounter (Signed)
Called patient and they are going to try the Melatonin and if he is still having issues regarding sleep to reach out to PCP  ?

## 2021-11-19 ENCOUNTER — Ambulatory Visit: Payer: Medicare Other | Admitting: Physical Therapy

## 2021-11-20 ENCOUNTER — Encounter (HOSPITAL_COMMUNITY)
Admission: RE | Admit: 2021-11-20 | Discharge: 2021-11-20 | Disposition: A | Discharge status: Home / Self Care | Payer: Medicare Other | Source: Ambulatory Visit | Attending: Neurology | Admitting: Neurology

## 2021-11-20 DIAGNOSIS — G20 Parkinson's disease: Secondary | ICD-10-CM | POA: Insufficient documentation

## 2021-11-20 DIAGNOSIS — R251 Tremor, unspecified: Secondary | ICD-10-CM | POA: Diagnosis present

## 2021-11-20 DIAGNOSIS — G20A1 Parkinson's disease without dyskinesia, without mention of fluctuations: Secondary | ICD-10-CM

## 2021-11-20 MED ORDER — POTASSIUM IODIDE (ANTIDOTE) 130 MG PO TABS
ORAL_TABLET | ORAL | Status: AC
  Filled 2021-11-20: qty 1

## 2021-11-20 MED ORDER — IOFLUPANE I 123 185 MBQ/2.5ML IV SOLN
4.0000 | Freq: Once | INTRAVENOUS | Status: AC | PRN
Start: 2021-11-20 — End: 2021-11-20
  Administered 2021-11-20: 4 via INTRAVENOUS
  Filled 2021-11-20: qty 5

## 2021-11-21 ENCOUNTER — Ambulatory Visit: Payer: Medicare Other | Admitting: Physical Therapy

## 2021-11-21 ENCOUNTER — Encounter: Payer: Self-pay | Admitting: Physical Therapy

## 2021-11-21 DIAGNOSIS — R293 Abnormal posture: Secondary | ICD-10-CM

## 2021-11-21 DIAGNOSIS — M6281 Muscle weakness (generalized): Secondary | ICD-10-CM

## 2021-11-21 DIAGNOSIS — R2689 Other abnormalities of gait and mobility: Secondary | ICD-10-CM

## 2021-11-21 DIAGNOSIS — R2681 Unsteadiness on feet: Secondary | ICD-10-CM

## 2021-11-21 NOTE — Therapy (Addendum)
[facility] [address], [place], [place], [address] Phone: [phone]   Fax:  [phone]  Physical Therapy Treatment  Patient Details  Name: Jose Hartman MRN: [id number] Date of Birth: 22-Dec-1960 Referring Provider (PT): Alonza Bogus, DO   Progress Note Reporting Period 10/24/21 to 11/21/21  See note below for Objective Data and Assessment of Progress/Goals.      Encounter Date: 11/21/2021   PT End of Session - 11/21/21 1145     Visit Number 7    Number of Visits 16   per 2x/wk frequency on POC   Date for PT Re-Evaluation 12/20/21    Authorization Type UHC Medicare    PT Start Time 1103    PT Stop Time 1143    PT Time Calculation (min) 40 min    Equipment Utilized During Treatment Gait belt    Activity Tolerance Patient tolerated treatment well    Behavior During Therapy WFL for tasks assessed/performed             Past Medical History:  Diagnosis Date   Arthritis    left arm    Asthma    Benign localized prostatic hyperplasia with lower urinary tract symptoms (LUTS)    Full dentures    GERD (gastroesophageal reflux disease)    History of adenomatous polyp of colon    History of kidney stones    Hyperlipidemia    Moderate asthma without complication    followed by pcp--- (11-06-2020 pt stated last exacerbation several years , well controlled with daily qvar inhaler)   Pre-diabetes    S/P balloon dilatation of esophageal stricture    followed by dr Therisa Doyne (GI)---  [phone];  01/ 2020;  11/ 2021   Wears glasses     Past Surgical History:  Procedure Laterality Date   BALLOON DILATION N/A 08/13/2018   Procedure: BALLOON DILATION;  Surgeon: Ronnette Juniper, MD;  [facility]: [facility];  Service: Gastroenterology;  Laterality: N/A;   COLONOSCOPY N/A 08/13/2018   Procedure: COLONOSCOPY;  Surgeon: Ronnette Juniper, MD;  Location: [facility];  Service: Gastroenterology;  Laterality: N/A;    CYSTOSCOPY/RETROGRADE/URETEROSCOPY/STONE EXTRACTION WITH BASKET Left 09/09/2013   Procedure: CYSTOSCOPY/LEFT RETROGRADE PYELOGRAM /FLEXIBLE URETEROSCOPY/INSERTION LEFT URETERAL STENT;  Surgeon: Ardis Hughs, MD;  Location: [facility];  Service: Urology;  Laterality: Left;   ESOPHAGOGASTRODUODENOSCOPY Left 05/15/2017   Procedure: ESOPHAGOGASTRODUODENOSCOPY (EGD);  Surgeon: Ronnette Juniper, MD;  Location: [facility];  Service: Gastroenterology;  Laterality: Left;   ESOPHAGOGASTRODUODENOSCOPY N/A 08/13/2018   Procedure: ESOPHAGOGASTRODUODENOSCOPY (EGD);  Surgeon: Ronnette Juniper, MD;  Location: [facility];  Service: Gastroenterology;  Laterality: N/A;   ESOPHAGOGASTRODUODENOSCOPY (EGD) WITH ESOPHAGEAL DILATION  11/ 2021  dr Therisa Doyne   EXTRACORPOREAL SHOCK WAVE LITHOTRIPSY  yrs ago   FRACTURE SURGERY  2002  approx.   left wrist, closed reduction    POLYPECTOMY  08/13/2018   Procedure: POLYPECTOMY;  Surgeon: Ronnette Juniper, MD;  Location: [facility];  Service: Gastroenterology;;   SEPTOPLASTY WITH ETHMOIDECTOMY, AND MAXILLARY ANTROSTOMY Bilateral 09/14/2018   Procedure: SEPTOPLASTY WITH ETHMOIDECTOMY, AND MAXILLARY ANTROSTOMY,SPHENOIDECTOMY AND TISSUE REMOVAL AND FRONTAL RECESS Pilar Grammes;  Surgeon: Leta Baptist, MD;  Location: [facility];  Service: ENT;  Laterality: Bilateral;   SINUS ENDO WITH FUSION Bilateral 09/14/2018   Procedure: SINUS ENDO WITH FUSION;  Surgeon: Leta Baptist, MD;  Location: [facility];  Service: ENT;  Laterality: Bilateral;   TRANSURETHRAL RESECTION OF PROSTATE N/A 11/09/2020   Procedure: TRANSURETHRAL RESECTION OF THE PROSTATE (  TURP), BIPOLAR;  Surgeon: Janith Lima, MD;  Location: [facility];  Service: Urology;  Laterality: N/A;    There were no vitals filed for this visit.   Subjective Assessment - 11/21/21 1103     Subjective Had a CT on his brain. No recent falls. Interested in the [place] classes on Wedneday.    Pertinent History RSD, Asthma, low back  pain, GERD    Patient Stated Goals Pt's goals for therapy is to help exercise with this Parkinson's.    Currently in Pain? Yes    Pain Score 6     Pain Location Arm    Pain Orientation Right;Left    Pain Descriptors / Indicators Burning    Pain Type Chronic pain                               [facility] Adult PT Treatment/Exercise - 11/21/21 0001       Neuro Re-ed    Neuro Re-ed Details  R/L lateral wt shift with/without reach; R/L ant/pos wt shift with/without arm swing; torso twist + gentle clap to avoid arm pain   in front of mirror for visual feedback and manual cues; CGA and intermittent min A     Knee/Hip Exercises: Aerobic   Nustep level 4 x 6" with UE's & LE's - cues to maintain 65-70 SPM                 Balance Exercises - 11/21/21 0001       Balance Exercises: Standing   Stepping Strategy Anterior;Posterior;Limitations    Stepping Strategy Limitations fwd/back stepping with CGA    Other Standing Exercises Comments alt toe tap on cone 20x, double tap on cone 2x20 with CGA and cueing to widen BOS                PT Education - 11/21/21 1144     Education Details edu on [facility] class Wednesday 11:00 and edu on POC    Person(s) Educated Patient    Methods Explanation;Handout    Comprehension Verbalized understanding              PT Short Term Goals - 11/13/21 1541       PT SHORT TERM GOAL #1   Title Pt will be independent with HEP for improved strength, balance, transfers, and gait.  TARGET 11/22/2021    Time 4    Period Weeks    Status New      PT SHORT TERM GOAL #2   Title Pt will improve 5x sit<>stand to less than or equal to 25 sec for imrpoved ease of transfers, improved functional strength.    Baseline 35.72 sec    Time 4    Period Weeks    Status New      PT SHORT TERM GOAL #3   Title Pt will improve DGI score to at least 12/24 for decreased fall risk.    Time 4    Period Weeks    Status New      PT SHORT TERM GOAL  #4   Title Pt will verbalize understanding of local Parkinson's resources.    Time 4    Period Weeks    Status New               PT Long Term Goals - 11/13/21 1541       PT LONG TERM GOAL #1   Title  Pt will be independent with HEP for improved strength, balance, transfers, and gait.  TARGET 12/20/2021    Time 8    Period Weeks    Status New      PT LONG TERM GOAL #2   Title Pt will improve 5x sit<>stand to less than or equal to 20 sec to demonstrate improved functional strength and transfer efficiency.    Time 8    Period Weeks    Status New      PT LONG TERM GOAL #3   Title Pt will improve TUG score/TUG cognitive score to less than or equal to 10% difference for decreased fall risk, improved dual tasking with gait.    Time 8    Period Weeks    Status New      PT LONG TERM GOAL #4   Title Pt will improve DGI score to at least 17/24 to decrease fall risk.    Time 8    Period Weeks    Status New      PT LONG TERM GOAL #5   Title Pt will verbalize understanding of fall prevention in home environment.    Time 8    Period Weeks    Status New                   Plan - 11/21/21 1145     Clinical Impression Statement Patient arrived to session without new complaints. Reports interest in [facility] classes, thus administered information for the Wednesday 11:00 class. Patient performed large amplitude weight shifts in front of mirror for visual feedback. Patient required intermittent cues increased amplitude of movement and required CGA and intermittent min A d/t imbalance. Able to progress toe taps and encouraged wider BOS for improved balance and stability. Patient tolerated this well. Patient reported that he would like to wait to schedule more appointments until he finds out how much his medical bills will cost. No complaints at end of session.    Personal Factors and Comorbidities Comorbidity 3+;Finances   Pt reports financial concerns may limit his therapy sessions    Examination-Activity Limitations Locomotion Level;Transfers;Stand;Caring for Others    Examination-Participation Restrictions Art gallery manager;Shop    Stability/Clinical Decision Making Evolving/Moderate complexity    Rehab Potential Good    PT Frequency 2x / week    PT Duration 8 weeks   PT recommends 2x/wk, but pt reports he can only commit to 1x/wk due to financial concerns.   PT Treatment/Interventions ADLs/Self Care Home Management;Gait training;Functional mobility training;Therapeutic activities;Therapeutic exercise;Balance training;Neuromuscular re-education;DME Instruction;Patient/family education    PT Next Visit Plan Start with Nustep - intensity rating 6-8/10; continue balance & gait training;  follow up on community resources    PT Home Exercise Plan [place] and Agree with Plan of Care Patient             Patient will benefit from skilled therapeutic intervention in order to improve the following deficits and impairments:  Abnormal gait, Difficulty walking, Decreased balance, Decreased mobility, Decreased strength, Postural dysfunction  Visit Diagnosis: Unsteadiness on feet  Other abnormalities of gait and mobility  Abnormal posture  Muscle weakness (generalized)     Problem List Patient Active Problem List   Diagnosis Date Noted   BPH (benign prostatic hyperplasia) 11/09/2020   Left nasal polyps 02/04/2016   Left LBP 01/16/2016   RSD upper limb 12/11/2015   Recurrent kidney stones 12/11/2015   Asthma 12/11/2015   Sinusitis, chronic 12/11/2015  Janene Harvey, PT, DPT 11/21/21 11:46 AM   [facility] [address], [place], [place], [address] Phone: [phone]   Fax:  [phone]  Name: Jose Hartman MRN: [id number] Date of Birth: 08-Feb-1961   PHYSICAL THERAPY DISCHARGE SUMMARY  Visits from Start of Care: 7  Current functional level related to goals /  functional outcomes: Unable to assess; patient did not return   Remaining deficits: Unable to assess   Education / Equipment: HEP  Plan: Patient agrees to discharge.  Patient goals were not met. Patient is being discharged due to not returning to PT.

## 2021-11-26 ENCOUNTER — Telehealth: Payer: Self-pay | Admitting: Neurology

## 2021-11-26 NOTE — Telephone Encounter (Signed)
Patient is sch for 4/20 ?

## 2021-11-26 NOTE — Telephone Encounter (Signed)
Patient's wife called and said the patient has been getting shooting pains in the last 4-5 days with weakness. This is a new symptom. ? ?She'd also like to know if he needs to quit his PD medication since he does not have PD. ?

## 2021-11-27 NOTE — Progress Notes (Signed)
? ?Virtual Visit Via Video  ? ?The purpose of this virtual visit is to provide medical care while limiting exposure to the novel coronavirus.   ? ?Consent was obtained for video visit:  Yes.   ?Answered questions that patient had about telehealth interaction:  Yes.   ?I discussed the limitations, risks, security and privacy concerns of performing an evaluation and management service by telemedicine. I also discussed with the patient that there may be a patient responsible charge related to this service. The patient expressed understanding and agreed to proceed. ? ?Pt location: Home ?Physician Location: office ?Name of referring provider:  Pieter Partridge, PA ?I connected with Jose Hartman at patients initiation/request on 11/28/2021 at  9:15 AM EDT by video enabled telemedicine application and verified that I am speaking with the correct person using two identifiers. ?Pt MRN:  [id number] ?Pt DOB:  [birth year] ?Video Participants:  Jose Hartman;  wife supplements hx ? ?Assessment/Plan:  ? ?1.  Tremor ? -DaTscan negative ? -Examination with functional features. ? -Discussed with him that I was initially wrong and he does not have Parkinson's disease.  This does not mean that he could not develop it in the future, but does not have it currently. ? -We will go ahead and stop levodopa ? -Previously had sent referral to neuro rehab, but appointments declined due to cost.  I do think that this could be helpful and we discussed this ? -Discussed impact of anxiety and stress on FND with patient and wife today ? ? ?2.  Chronic pain along with diffuse giveaway weakness ?            -Patient has reestablished with pain management ?   ?3.  B12 deficiency ?            -pt with evidence of B12 deficiency.  On supplementation. ?Subjective:  ? ?Jose Hartman was seen today in follow up for review of DaTscan.  Patient accompanied by wife who supplements history.  Patient had DaTscan completed on November 20, 2021.   DaTscan was normal.  They did send me an email about his pain and whether or not to stop levodopa, based on the DaTscan.  They were told that they needed to follow-up with pain management for his chronic pain, but decided to have a visit today to discuss what to do with the levodopa. ? ?Current prescribed movement disorder medications: ?Carbidopa/levodopa 25/100, 1 tablet 3 times per day (started last visit) ? ? ?PREVIOUS MEDICATIONS: Sinemet; primidone (PCP) ? ?ALLERGIES:   ?Allergies  ?Allergen Reactions  ? Benadryl [Diphenhydramine Hcl] Other (See Comments)  ?  Causes him to be hyper  ? Peanuts [Peanut Oil] Hives  ?  Makes ears red and burn   ? ? ?CURRENT MEDICATIONS:  ?Current Meds  ?Medication Sig  ? albuterol (VENTOLIN HFA) 108 (90 Base) MCG/ACT inhaler Inhale 1-2 puffs into the lungs every 6 (six) hours as needed.  ? aspirin EC 81 MG tablet Take 81 mg by mouth daily.  ? b complex vitamins capsule Take 1 capsule by mouth daily.  ? carbidopa-levodopa (SINEMET IR) 25-100 MG tablet 1 three times per day at 8am/noon/4pm as directed to pt  ? Carbidopa-Levodopa ER (SINEMET CR) 25-100 MG tablet controlled release Take 1 tablet by mouth at bedtime.  ? Docusate Sodium (DSS) 100 MG CAPS Take 1 capsule by mouth 3 (three) times daily.  ? mometasone-formoterol (DULERA) 200-5 MCG/ACT AERO Inhale 2 puffs into the lungs 2 (two) times  daily.  ? pantoprazole (PROTONIX) 40 MG tablet Take 40 mg by mouth 2 (two) times daily.  ? pravastatin (PRAVACHOL) 40 MG tablet Take 40 mg by mouth at bedtime.  ? primidone (MYSOLINE) 50 MG tablet Take 50 mg by mouth daily.  ? QVAR REDIHALER 80 MCG/ACT inhaler Inhale 1 puff into the lungs 2 (two) times daily.  ? sucralfate (CARAFATE) 1 g tablet Take 1 g by mouth 2 (two) times daily.  ? tamsulosin (FLOMAX) 0.4 MG CAPS capsule Take 0.8 mg by mouth at bedtime.  ? traZODone (DESYREL) 50 MG tablet Take 50 mg by mouth at bedtime.  ? ? ? ?Objective:  ? ?PHYSICAL EXAMINATION:   ? ?VITALS:   ?Vitals:  ?  11/28/21 0823  ?Weight: 153 lb (69.4 kg)  ?Height: '5\' 7"'$  (1.702 m)  ? ? ? ?GEN:  The patient appears stated age and is in NAD. ?HEENT:  Normocephalic, atraumatic.   ? ?Neurological examination: ? ?Orientation: The patient is alert and oriented x3.  ?Cranial nerves: There is good facial symmetry.   Speech is fluent and clear. ? ? ? ? ?I have reviewed and interpreted the following labs independently ? ?  Chemistry   ?   ?Component Value Date/Time  ? NA 139 11/10/2020 0513  ? K 4.6 11/10/2020 0513  ? CL 106 11/10/2020 0513  ? CO2 24 11/10/2020 0513  ? BUN 16 11/10/2020 0513  ? CREATININE 0.99 11/10/2020 0513  ? CREATININE 0.86 12/11/2015 1456  ?    ?Component Value Date/Time  ? CALCIUM 9.0 11/10/2020 0513  ? ALKPHOS 67 01/04/2020 2140  ? AST 27 01/04/2020 2140  ? ALT 26 01/04/2020 2140  ? BILITOT 0.7 01/04/2020 2140  ?  ? ? ? ?Lab Results  ?Component Value Date  ? WBC 11.3 (H) 11/10/2020  ? HGB 14.2 11/10/2020  ? HCT 43.9 11/10/2020  ? MCV 93.0 11/10/2020  ? PLT 227 11/10/2020  ? ? ?No results found for: TSH ? ? ? ?Cc:  Pieter Partridge, PA ? ?

## 2021-11-28 ENCOUNTER — Telehealth (INDEPENDENT_AMBULATORY_CARE_PROVIDER_SITE_OTHER): Payer: Medicare Other | Admitting: Neurology

## 2021-11-28 VITALS — Ht 67.0 in | Wt 153.0 lb

## 2021-11-28 DIAGNOSIS — G894 Chronic pain syndrome: Secondary | ICD-10-CM

## 2021-11-28 DIAGNOSIS — R251 Tremor, unspecified: Secondary | ICD-10-CM

## 2022-04-01 ENCOUNTER — Ambulatory Visit: Payer: Medicare Other | Admitting: Neurology

## 2022-04-09 ENCOUNTER — Emergency Department (HOSPITAL_BASED_OUTPATIENT_CLINIC_OR_DEPARTMENT_OTHER)
Admission: EM | Admit: 2022-04-09 | Discharge: 2022-04-09 | Disposition: A | Discharge status: Home / Self Care | Payer: Medicare Other | Attending: Emergency Medicine | Admitting: Emergency Medicine

## 2022-04-09 ENCOUNTER — Encounter (HOSPITAL_BASED_OUTPATIENT_CLINIC_OR_DEPARTMENT_OTHER): Payer: Self-pay

## 2022-04-09 ENCOUNTER — Other Ambulatory Visit: Payer: Self-pay

## 2022-04-09 DIAGNOSIS — B880 Other acariasis: Secondary | ICD-10-CM

## 2022-04-09 DIAGNOSIS — Z7982 Long term (current) use of aspirin: Secondary | ICD-10-CM | POA: Insufficient documentation

## 2022-04-09 DIAGNOSIS — R21 Rash and other nonspecific skin eruption: Secondary | ICD-10-CM | POA: Diagnosis present

## 2022-04-09 MED ORDER — DEXAMETHASONE SODIUM PHOSPHATE 10 MG/ML IJ SOLN
10.0000 mg | Freq: Once | INTRAMUSCULAR | Status: AC
  Administered 2022-04-09: 10 mg via INTRAMUSCULAR
  Filled 2022-04-09: qty 1

## 2022-04-09 MED ORDER — TRIAMCINOLONE ACETONIDE 0.1 % EX CREA: 1.0000 | TOPICAL_CREAM | Freq: Two times a day (BID) | CUTANEOUS | 0 refills | Status: AC

## 2022-04-09 NOTE — ED Notes (Signed)
Multiple bites from chiggers. Has happened before, stated it feels like the last time. Started Friday.

## 2022-04-09 NOTE — ED Provider Notes (Signed)
[place] EMERGENCY DEPT Provider Note   CSN: [id number] Arrival date & time: 04/09/22  1009     History  Chief Complaint  Patient presents with   Chiggers    Jose Hartman is a 61 y.o. male.  Patient presents for assessment due to symptoms from sugars.  Patient's had them before and feels similar to previous.  Patient has itching and rash worse on the legs.  No fevers or chills.  Patient tried nail polish to help.       Home Medications Prior to Admission medications   Medication Sig Start Date End Date Taking? Authorizing Provider  triamcinolone cream (KENALOG) 0.1 % Apply 1 Application topically 2 (two) times daily. 04/09/22  Yes Elnora Morrison, MD  albuterol (VENTOLIN HFA) 108 (90 Base) MCG/ACT inhaler Inhale 1-2 puffs into the lungs every 6 (six) hours as needed. 03/05/20   [provider]  aspirin EC 81 MG tablet Take 81 mg by mouth daily.    [provider]  b complex vitamins capsule Take 1 capsule by mouth daily.    [provider]  carbidopa-levodopa (SINEMET IR) 25-100 MG tablet 1 three times per day at 8am/noon/4pm as directed to pt 10/07/21   Tat, Wells Guiles S, DO  Carbidopa-Levodopa ER (SINEMET CR) 25-100 MG tablet controlled release Take 1 tablet by mouth at bedtime. 11/07/21   Tat, Eustace Quail, DO  Docusate Sodium (DSS) 100 MG CAPS Take 1 capsule by mouth 3 (three) times daily.    [provider]  mometasone-formoterol (DULERA) 200-5 MCG/ACT AERO Inhale 2 puffs into the lungs 2 (two) times daily.    [provider]  pantoprazole (PROTONIX) 40 MG tablet Take 40 mg by mouth 2 (two) times daily. 11/23/19   [provider]  pravastatin (PRAVACHOL) 40 MG tablet Take 40 mg by mouth at bedtime. 08/03/20   [provider]  primidone (MYSOLINE) 50 MG tablet Take 50 mg by mouth daily. 09/24/21   [provider]  QVAR REDIHALER 80 MCG/ACT inhaler Inhale 1 puff into the lungs 2 (two) times  daily. 09/04/20   [provider]  sucralfate (CARAFATE) 1 g tablet Take 1 g by mouth 2 (two) times daily.    [provider]  tamsulosin (FLOMAX) 0.4 MG CAPS capsule Take 0.8 mg by mouth at bedtime. 08/29/20   [provider]  traZODone (DESYREL) 50 MG tablet Take 50 mg by mouth at bedtime.    [provider]      Allergies    Benadryl [diphenhydramine hcl] and Peanuts [peanut oil]    Review of Systems   Review of Systems  Constitutional:  Negative for chills and fever.  HENT:  Negative for congestion.   Eyes:  Negative for visual disturbance.  Respiratory:  Negative for shortness of breath.   Cardiovascular:  Negative for chest pain.  Gastrointestinal:  Negative for abdominal pain and vomiting.  Genitourinary:  Negative for dysuria and flank pain.  Musculoskeletal:  Negative for back pain, neck pain and neck stiffness.  Skin:  Positive for rash.  Neurological:  Negative for light-headedness and headaches.    Physical Exam Updated Vital Signs BP (!) 120/90 (BP Location: Right Arm)   Pulse (!) 58   Temp 97.9 F (36.6 C)   Resp 16   SpO2 100%  Physical Exam Vitals and nursing note reviewed.  Constitutional:      General: He is not in acute distress.    Appearance: He is well-developed.  HENT:  Head: Normocephalic and atraumatic.     Mouth/Throat:     Mouth: Mucous membranes are moist.  Eyes:     General:        Right eye: No discharge.        Left eye: No discharge.     Conjunctiva/sclera: Conjunctivae normal.  Neck:     Trachea: No tracheal deviation.  Cardiovascular:     Rate and Rhythm: Normal rate.  Pulmonary:     Effort: Pulmonary effort is normal.  Abdominal:     General: There is no distension.  Musculoskeletal:     Cervical back: Normal range of motion and neck supple. No rigidity.  Skin:    General: Skin is warm.     Capillary Refill: Capillary refill takes less than 2 seconds.     Findings: Rash present.      Comments: Macules lower ext with mild excoriations, no cellulitis, no induration or cellulitis  Neurological:     General: No focal deficit present.     Mental Status: He is alert.  Psychiatric:        Mood and Affect: Mood normal.     ED Results / Procedures / Treatments   Labs (all labs ordered are listed, but only abnormal results are displayed) Labs Reviewed - No data to display  EKG None  Radiology No results found.  Procedures Procedures    Medications Ordered in ED Medications  dexamethasone (DECADRON) injection 10 mg (has no administration in time range)    ED Course/ Medical Decision Making/ A&P                           Medical Decision Making Risk Prescription drug management.   Patient presents with clinical concern for mite/chiggers, other insect bites considered.  No evidence of poison ivy.  No evidence of cellulitis.  Patient is nondiabetic.  Decadron shot given and topical steroids for home.  Patient comfortable this plan.        Final Clinical Impression(s) / ED Diagnoses Final diagnoses:  Chiggers    Rx / DC Orders ED Discharge Orders          Ordered    triamcinolone cream (KENALOG) 0.1 %  2 times daily        04/09/22 1335              Elnora Morrison, MD 04/09/22 1340

## 2022-04-09 NOTE — ED Triage Notes (Signed)
He c/o rash at extremities which he theorizes is from [place].

## 2022-04-09 NOTE — Discharge Instructions (Addendum)
Use topical cream twice daily as needed up to 7 days.  Return for new concerns.

## 2022-04-09 NOTE — ED Notes (Signed)
Patient verbalizes understanding of discharge instructions. Opportunity for questioning and answers were provided. Patient discharged from ED.  °

## 2022-12-17 ENCOUNTER — Encounter (HOSPITAL_COMMUNITY): Payer: Self-pay

## 2022-12-17 ENCOUNTER — Emergency Department (HOSPITAL_COMMUNITY)
Admission: EM | Admit: 2022-12-17 | Discharge: 2022-12-17 | Disposition: A | Discharge status: Home / Self Care | Payer: 59 | Attending: Emergency Medicine | Admitting: Emergency Medicine

## 2022-12-17 DIAGNOSIS — Z7982 Long term (current) use of aspirin: Secondary | ICD-10-CM | POA: Insufficient documentation

## 2022-12-17 DIAGNOSIS — M62838 Other muscle spasm: Secondary | ICD-10-CM | POA: Diagnosis not present

## 2022-12-17 DIAGNOSIS — R519 Headache, unspecified: Secondary | ICD-10-CM | POA: Insufficient documentation

## 2022-12-17 DIAGNOSIS — Z91010 Allergy to peanuts: Secondary | ICD-10-CM | POA: Diagnosis not present

## 2022-12-17 MED ORDER — PROCHLORPERAZINE EDISYLATE 10 MG/2ML IJ SOLN
10.0000 mg | Freq: Once | INTRAMUSCULAR | Status: DC
  Filled 2022-12-17: qty 2

## 2022-12-17 MED ORDER — DIPHENHYDRAMINE HCL 50 MG/ML IJ SOLN
12.5000 mg | Freq: Once | INTRAMUSCULAR | Status: DC
  Filled 2022-12-17: qty 1

## 2022-12-17 MED ORDER — DIPHENHYDRAMINE HCL 50 MG/ML IJ SOLN
12.5000 mg | Freq: Once | INTRAMUSCULAR | Status: AC
  Administered 2022-12-17: 12.5 mg via INTRAVENOUS

## 2022-12-17 MED ORDER — KETOROLAC TROMETHAMINE 15 MG/ML IJ SOLN
15.0000 mg | Freq: Once | INTRAMUSCULAR | Status: DC
  Filled 2022-12-17: qty 1

## 2022-12-17 MED ORDER — DEXAMETHASONE 4 MG PO TABS
10.0000 mg | ORAL_TABLET | Freq: Once | ORAL | Status: AC
  Administered 2022-12-17: 10 mg via ORAL
  Filled 2022-12-17: qty 1

## 2022-12-17 MED ORDER — PROCHLORPERAZINE EDISYLATE 10 MG/2ML IJ SOLN
10.0000 mg | Freq: Once | INTRAMUSCULAR | Status: AC
  Administered 2022-12-17: 10 mg via INTRAVENOUS

## 2022-12-17 MED ORDER — KETOROLAC TROMETHAMINE 15 MG/ML IJ SOLN
15.0000 mg | Freq: Once | INTRAMUSCULAR | Status: AC
  Administered 2022-12-17: 15 mg via INTRAVENOUS

## 2022-12-17 NOTE — ED Notes (Signed)
MD at bedside. 

## 2022-12-17 NOTE — ED Triage Notes (Addendum)
Patients reports back pain that radiates to the back of his head for over a month. Patient states it effects his left side. States the left side of his head "feels funny". States when the headache occurs, his hearing is effected. History of stroke and tremors. No blood thinners. Rates pain 6/10. No changes in speech, vision, or movements. Denies weakness.

## 2022-12-17 NOTE — ED Provider Notes (Signed)
[place] EMERGENCY DEPARTMENT AT [facility] Provider Note   CSN: [id number] Arrival date & time: 12/17/22  1951     History  Chief Complaint  Patient presents with   Back Pain   Headache    Jose Hartman is a 62 y.o. male.  62 yo M with a chief complaints of an occipital headache.  This been going on for a few months.  He tells me that he feels like his back is hurting and then it goes up into the back of his head and wraps around the ear and stops above the eye.  Last for maybe a few minutes at max and then goes away.  Was hurting on both sides and more recently has been more on the left than the right.  He denies injury to his head or his neck.  Denies one-sided numbness or weakness denies difficulty speech or swallowing.  Denies fevers.   Back Pain Associated symptoms: headaches   Headache Associated symptoms: back pain        Home Medications Prior to Admission medications   Medication Sig Start Date End Date Taking? Authorizing Provider  albuterol (VENTOLIN HFA) 108 (90 Base) MCG/ACT inhaler Inhale 1-2 puffs into the lungs every 6 (six) hours as needed. 03/05/20   [provider]  aspirin EC 81 MG tablet Take 81 mg by mouth daily.    [provider]  b complex vitamins capsule Take 1 capsule by mouth daily.    [provider]  carbidopa-levodopa (SINEMET IR) 25-100 MG tablet 1 three times per day at 8am/noon/4pm as directed to pt 10/07/21   Tat, Lurena Joiner S, DO  Carbidopa-Levodopa ER (SINEMET CR) 25-100 MG tablet controlled release Take 1 tablet by mouth at bedtime. 11/07/21   Tat, Octaviano Batty, DO  Docusate Sodium (DSS) 100 MG CAPS Take 1 capsule by mouth 3 (three) times daily.    [provider]  mometasone-formoterol (DULERA) 200-5 MCG/ACT AERO Inhale 2 puffs into the lungs 2 (two) times daily.    [provider]  pantoprazole (PROTONIX) 40 MG tablet Take 40 mg by mouth 2 (two) times daily. 11/23/19   [provider]  pravastatin (PRAVACHOL) 40 MG tablet Take 40 mg by mouth at bedtime. 08/03/20   [provider]  primidone (MYSOLINE) 50 MG tablet Take 50 mg by mouth daily. 09/24/21   [provider]  QVAR REDIHALER 80 MCG/ACT inhaler Inhale 1 puff into the lungs 2 (two) times daily. 09/04/20   [provider]  sucralfate (CARAFATE) 1 g tablet Take 1 g by mouth 2 (two) times daily.    [provider]  tamsulosin (FLOMAX) 0.4 MG CAPS capsule Take 0.8 mg by mouth at bedtime. 08/29/20   [provider]  traZODone (DESYREL) 50 MG tablet Take 50 mg by mouth at bedtime.    [provider]  triamcinolone cream (KENALOG) 0.1 % Apply 1 Application topically 2 (two) times daily. 04/09/22   Blane Ohara, MD      Allergies    Benadryl [diphenhydramine hcl] and Peanuts [peanut oil]    Review of Systems   Review of Systems  Musculoskeletal:  Positive for back pain.  Neurological:  Positive for headaches.    Physical Exam Updated Vital Signs BP (!) 126/103   Pulse 90   Temp 98.9 F (37.2 C) (Oral)   Resp 17   Ht 5\' 7"  (1.702 m)   Wt 70.3 kg   SpO2 98%   BMI  24.28 kg/m  Physical Exam Vitals and nursing note reviewed.  Constitutional:      Appearance: He is well-developed.  HENT:     Head: Normocephalic and atraumatic.  Eyes:     Pupils: Pupils are equal, round, and reactive to light.  Neck:     Vascular: No JVD.  Cardiovascular:     Rate and Rhythm: Normal rate and regular rhythm.     Heart sounds: No murmur heard.    No friction rub. No gallop.  Pulmonary:     Effort: No respiratory distress.     Breath sounds: No wheezing.  Abdominal:     General: There is no distension.     Tenderness: There is no abdominal tenderness. There is no guarding or rebound.  Musculoskeletal:        General: Normal range of motion.     Cervical back: Normal range of motion and neck supple.     Comments: He has no pain over the trapezius  bilaterally but has significant spasm bilaterally.  Skin:    Coloration: Skin is not pale.     Findings: No rash.  Neurological:     Mental Status: He is alert and oriented to person, place, and time.     Comments: Patient has a bilateral hand flapping that seems to stop when distracted.  He has a benign neurologic exam otherwise.   Psychiatric:        Behavior: Behavior normal.     ED Results / Procedures / Treatments   Labs (all labs ordered are listed, but only abnormal results are displayed) Labs Reviewed - No data to display  EKG None  Radiology No results found.  Procedures Procedures    Medications Ordered in ED Medications  prochlorperazine (COMPAZINE) injection 10 mg (has no administration in time range)  diphenhydrAMINE (BENADRYL) injection 12.5 mg (has no administration in time range)  ketorolac (TORADOL) 15 MG/ML injection 15 mg (has no administration in time range)  dexamethasone (DECADRON) tablet 10 mg (has no administration in time range)    ED Course/ Medical Decision Making/ A&P                             Medical Decision Making Risk Prescription drug management.   62 yo M with a chief complaints of recurrent headaches.  He tells me that these started in his back and then radiate up to the head.  Was bilateral and now is left-sided.  They seem to come and go.  No obvious inciting event for him.  His neurologic exam is benign.  He had a couple headaches during the exam.  Lasted for seconds and then resolved.  He has significant trapezius spasm.  Will give a headache cocktail here.  Encouraged him to follow-up with his neurologist in the office.  8:34 PM:  I have discussed the diagnosis/risks/treatment options with the patient.  Evaluation and diagnostic testing in the emergency department does not suggest an emergent condition requiring admission or immediate intervention beyond what has been performed at this time.  They will follow up with PCP. We also  discussed returning to the ED immediately if new or worsening sx occur. We discussed the sx which are most concerning (e.g., sudden worsening pain, fever, inability to tolerate by mouth) that necessitate immediate return. Medications administered to the patient during their visit and any new prescriptions provided to the patient are listed below.  Medications given during this visit  Medications  prochlorperazine (COMPAZINE) injection 10 mg (has no administration in time range)  diphenhydrAMINE (BENADRYL) injection 12.5 mg (has no administration in time range)  ketorolac (TORADOL) 15 MG/ML injection 15 mg (has no administration in time range)  dexamethasone (DECADRON) tablet 10 mg (has no administration in time range)     The patient appears reasonably screen and/or stabilized for discharge and I doubt any other medical condition or other [facility] requiring further screening, evaluation, or treatment in the ED at this time prior to discharge.          Final Clinical Impression(s) / ED Diagnoses Final diagnoses:  Occipital headache    Rx / DC Orders ED Discharge Orders          Ordered    Ambulatory referral to Neurology       Comments: New headache syndrome   12/17/22 2028              Melene Plan, DO 12/17/22 2034

## 2022-12-17 NOTE — ED Notes (Signed)
Pt ambulated from ed with steady gait. Pt dressed for discharge. Pt verbalized understanding of discharge instructions. Pt has access to home. IV removed. Family to drive home

## 2022-12-17 NOTE — Discharge Instructions (Signed)
am not sure of the exact cause of your headaches.Ibased on your exam I think the most likely cause would be due to tightness of your trapezius muscles.  I have given you follow-up for neurology in our system.  Please feel free to see the neurologist you have already seen through the [facility] system.

## 2022-12-21 ENCOUNTER — Emergency Department (HOSPITAL_COMMUNITY)
Admission: EM | Admit: 2022-12-21 | Discharge: 2022-12-21 | Disposition: A | Discharge status: Home / Self Care | Payer: 59 | Attending: Student | Admitting: Student

## 2022-12-21 ENCOUNTER — Other Ambulatory Visit: Payer: Self-pay

## 2022-12-21 DIAGNOSIS — W274XXA Contact with kitchen utensil, initial encounter: Secondary | ICD-10-CM | POA: Insufficient documentation

## 2022-12-21 DIAGNOSIS — Z91010 Allergy to peanuts: Secondary | ICD-10-CM | POA: Insufficient documentation

## 2022-12-21 DIAGNOSIS — S61412A Laceration without foreign body of left hand, initial encounter: Secondary | ICD-10-CM | POA: Insufficient documentation

## 2022-12-21 DIAGNOSIS — Z7951 Long term (current) use of inhaled steroids: Secondary | ICD-10-CM | POA: Diagnosis not present

## 2022-12-21 DIAGNOSIS — J45909 Unspecified asthma, uncomplicated: Secondary | ICD-10-CM | POA: Diagnosis not present

## 2022-12-21 DIAGNOSIS — S6992XA Unspecified injury of left wrist, hand and finger(s), initial encounter: Secondary | ICD-10-CM | POA: Diagnosis present

## 2022-12-21 DIAGNOSIS — Z23 Encounter for immunization: Secondary | ICD-10-CM | POA: Diagnosis not present

## 2022-12-21 DIAGNOSIS — F17200 Nicotine dependence, unspecified, uncomplicated: Secondary | ICD-10-CM | POA: Diagnosis not present

## 2022-12-21 DIAGNOSIS — Z7982 Long term (current) use of aspirin: Secondary | ICD-10-CM | POA: Diagnosis not present

## 2022-12-21 MED ORDER — TETANUS-DIPHTH-ACELL PERTUSSIS 5-2.5-18.5 LF-MCG/0.5 IM SUSY
0.5000 mL | PREFILLED_SYRINGE | Freq: Once | INTRAMUSCULAR | Status: AC
Start: 2022-12-21 — End: 2022-12-21
  Administered 2022-12-21: 0.5 mL via INTRAMUSCULAR
  Filled 2022-12-21: qty 0.5

## 2022-12-21 NOTE — ED Triage Notes (Addendum)
Pt arrives c/o laceration to L wrist. States that he was using a mandolin slicer when he accidentally cut wrist. Occurred approx 2 hours PTA. Pt states that he attempted to clean wound and it started squiring blood. Tetanus <5 years. Took 400 mg ibuprofen prior to arrival.  Wound assessed in triage and moderate amount of bleeding noted. Pressure dressing applied.

## 2022-12-22 NOTE — ED Provider Notes (Signed)
[place] EMERGENCY DEPARTMENT AT [facility] Provider Note  CSN: [id number] Arrival date & time: 12/21/22 2046  Chief Complaint(s) Laceration  HPI Jose Hartman is a 62 y.o. male with PMH asthma, GERD, migraines who presents emergency department for evaluation of a hand laceration.  Patient states that he was using a mandolin and suffered a laceration to the thenar eminence approximately 2 hours prior to arrival.  He attempted to clean the wound and patient's son states that he noticed possible pulsatile bleeding.  A pressure bandage was applied and patient comes to the emergency department for further evaluation.  He denies numbness, tingling, weakness of the hand and pulses are intact.  Denies additional traumatic complaints.   Past Medical History Past Medical History:  Diagnosis Date   Arthritis    left arm    Asthma    Benign localized prostatic hyperplasia with lower urinary tract symptoms (LUTS)    Full dentures    GERD (gastroesophageal reflux disease)    History of adenomatous polyp of colon    History of kidney stones    Hyperlipidemia    Moderate asthma without complication    followed by pcp--- (11-06-2020 pt stated last exacerbation several years , well controlled with daily qvar inhaler)   Pre-diabetes    S/P balloon dilatation of esophageal stricture    followed by dr Marca Ancona (GI)---  [id number];  01/ 2020;  11/ 2021   Wears glasses    Patient Active Problem List   Diagnosis Date Noted   BPH (benign prostatic hyperplasia) 11/09/2020   Left nasal polyps 02/04/2016   Left LBP 01/16/2016   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 Authorizing Provider  albuterol (VENTOLIN HFA) 108 (90 Base) MCG/ACT inhaler Inhale 1-2 puffs into the lungs every 6 (six) hours as needed. 03/05/20    [provider]  aspirin EC 81 MG tablet Take 81 mg by mouth daily.    [provider]  b complex vitamins capsule Take 1 capsule by mouth daily.    [provider]  carbidopa-levodopa (SINEMET IR) 25-100 MG tablet 1 three times per day at 8am/noon/4pm as directed to pt 10/07/21   Tat, Lurena Joiner S, DO  Carbidopa-Levodopa ER (SINEMET CR) 25-100 MG tablet controlled release Take 1 tablet by mouth at bedtime. 11/07/21   Tat, Octaviano Batty, DO  Docusate Sodium (DSS) 100 MG CAPS Take 1 capsule by mouth 3 (three) times daily.    [provider]  mometasone-formoterol (DULERA) 200-5 MCG/ACT AERO Inhale 2 puffs into the lungs 2 (two) times daily.    [provider]  pantoprazole (PROTONIX) 40 MG tablet Take 40 mg by mouth 2 (two) times daily. 11/23/19   [provider]  pravastatin (PRAVACHOL) 40 MG tablet Take 40 mg by mouth at bedtime. 08/03/20   [provider]  primidone (MYSOLINE) 50 MG tablet Take 50 mg by mouth daily. 09/24/21   [provider]  QVAR REDIHALER 80 MCG/ACT inhaler Inhale 1 puff into the lungs 2 (two) times daily. 09/04/20   [provider]  sucralfate (CARAFATE) 1 g tablet Take 1 g by mouth 2 (two) times daily.    [provider]  tamsulosin (FLOMAX) 0.4 MG CAPS capsule Take 0.8 mg by mouth at bedtime. 08/29/20   [provider]  traZODone (DESYREL) 50  MG tablet Take 50 mg by mouth at bedtime.    [provider]  triamcinolone cream (KENALOG) 0.1 % Apply 1 Application topically 2 (two) times daily. 04/09/22   Blane Ohara, MD                                                                                                                                    Past Surgical History Past Surgical History:  Procedure Laterality Date   BALLOON DILATION N/A 08/13/2018   Procedure: BALLOON DILATION;  Surgeon: Kerin Salen, MD;  Location: [facility];  Service: Gastroenterology;  Laterality: N/A;    COLONOSCOPY N/A 08/13/2018   Procedure: COLONOSCOPY;  Surgeon: Kerin Salen, MD;  Location: [facility];  Service: Gastroenterology;  Laterality: N/A;   CYSTOSCOPY/RETROGRADE/URETEROSCOPY/STONE EXTRACTION WITH BASKET Left 09/09/2013   Procedure: CYSTOSCOPY/LEFT RETROGRADE PYELOGRAM /FLEXIBLE URETEROSCOPY/INSERTION LEFT URETERAL STENT;  Surgeon: Crist Fat, MD;  Location: [facility];  Service: Urology;  Laterality: Left;   ESOPHAGOGASTRODUODENOSCOPY Left 05/15/2017   Procedure: ESOPHAGOGASTRODUODENOSCOPY (EGD);  Surgeon: Kerin Salen, MD;  Location: [facility];  Service: Gastroenterology;  Laterality: Left;   ESOPHAGOGASTRODUODENOSCOPY N/A 08/13/2018   Procedure: ESOPHAGOGASTRODUODENOSCOPY (EGD);  Surgeon: Kerin Salen, MD;  Location: [facility];  Service: Gastroenterology;  Laterality: N/A;   ESOPHAGOGASTRODUODENOSCOPY (EGD) WITH ESOPHAGEAL DILATION  11/ 2021  dr Marca Ancona   EXTRACORPOREAL SHOCK WAVE LITHOTRIPSY  yrs ago   FRACTURE SURGERY  2002  approx.   left wrist, closed reduction    POLYPECTOMY  08/13/2018   Procedure: POLYPECTOMY;  Surgeon: Kerin Salen, MD;  Location: [facility];  Service: Gastroenterology;;   SEPTOPLASTY WITH ETHMOIDECTOMY, AND MAXILLARY ANTROSTOMY Bilateral 09/14/2018   Procedure: SEPTOPLASTY WITH ETHMOIDECTOMY, AND MAXILLARY ANTROSTOMY,SPHENOIDECTOMY AND TISSUE REMOVAL AND FRONTAL RECESS Lance Coon;  Surgeon: Newman Pies, MD;  Location: [facility];  Service: ENT;  Laterality: Bilateral;   SINUS ENDO WITH FUSION Bilateral 09/14/2018   Procedure: SINUS ENDO WITH FUSION;  Surgeon: Newman Pies, MD;  Location: [facility];  Service: ENT;  Laterality: Bilateral;   TRANSURETHRAL RESECTION OF PROSTATE N/A 11/09/2020   Procedure: TRANSURETHRAL RESECTION OF THE PROSTATE (TURP), BIPOLAR;  Surgeon: Jannifer Hick, MD;  Location: [facility];  Service: Urology;  Laterality: N/A;   Family History Family History  Problem Relation Age of Onset   Atrial  fibrillation Mother    Transient ischemic attack Mother    Heart attack Father    Esophageal cancer Brother     Social History Social History   Tobacco Use   Smoking status: Never   Smokeless tobacco: Current    Types: Chew  Vaping Use   Vaping Use: Never used  Substance Use Topics   Alcohol use: Yes    Comment: rare beer   Drug use: Never   Allergies Benadryl [diphenhydramine hcl] and Peanuts [peanut oil]  Review of Systems Review of Systems  Skin:  Positive for wound.  Physical Exam Vital Signs  I have reviewed the triage vital signs BP (!) 137/94 (BP Location: Right Arm)   Pulse 87   Temp 98.3 F (36.8 C)   Resp 16   Ht 5\' 7"  (1.702 m)   Wt 70.3 kg   SpO2 97%   BMI 24.28 kg/m   Physical Exam Constitutional:      General: He is not in acute distress.    Appearance: Normal appearance.  HENT:     Head: Normocephalic and atraumatic.     Nose: No congestion or rhinorrhea.  Eyes:     General:        Right eye: No discharge.        Left eye: No discharge.     Extraocular Movements: Extraocular movements intact.     Pupils: Pupils are equal, round, and reactive to light.  Cardiovascular:     Rate and Rhythm: Normal rate and regular rhythm.     Heart sounds: No murmur heard. Pulmonary:     Effort: No respiratory distress.     Breath sounds: No wheezing or rales.  Abdominal:     General: There is no distension.     Tenderness: There is no abdominal tenderness.  Musculoskeletal:        General: Normal range of motion.     Cervical back: Normal range of motion.  Skin:    General: Skin is warm and dry.     Findings: Lesion present.  Neurological:     General: No focal deficit present.     Mental Status: He is alert.     ED Results and Treatments Labs (all labs ordered are listed, but only abnormal results are displayed) Labs Reviewed - No data to display                                                                                                                         Radiology No results found.  Pertinent labs & imaging results that were available during my care of the patient were reviewed by me and considered in my medical decision making (see MDM for details).  Medications Ordered in ED Medications  Tdap (BOOSTRIX) injection 0.5 mL (0.5 mLs Intramuscular Given 12/21/22 2205)  Procedures .Marland KitchenLaceration Repair  Date/Time: 12/22/2022 1:16 PM  Performed by: Glendora Score, MD Authorized by: Glendora Score, MD   Anesthesia:    Anesthesia method:  Local infiltration   Local anesthetic:  Lidocaine 2% WITH epi Laceration details:    Location:  Hand   Hand location:  L palm   Length (cm):  4 Pre-procedure details:    Preparation:  Patient was prepped and draped in usual sterile fashion Exploration:    Hemostasis achieved with:  Tourniquet (Pneumatic tourniquet via BP cuff)   Wound exploration: wound explored through full range of motion     Wound extent: no nerve damage and no tendon damage     Contaminated: no   Treatment:    Area cleansed with:  Saline   Amount of cleaning:  Standard   Irrigation solution:  Sterile saline Skin repair:    Repair method:  Sutures   Suture size:  4-0   Suture material:  Prolene   Suture technique:  Horizontal mattress and simple interrupted   Number of sutures:  7 Approximation:    Approximation:  Close Repair type:    Repair type:  Intermediate Post-procedure details:    Dressing:  Non-adherent dressing   Procedure completion:  Tolerated well, no immediate complications   (including critical care time)  Medical Decision Making / ED Course   This patient presents to the ED for concern of hand laceration, this involves an extensive number of treatment options, and is a complaint that carries with it a high risk of complications and morbidity.   The differential diagnosis includes laceration, tendon injury, arterial injury, venous injury, fracture, retained foreign body  MDM: Patient seen emergency room for evaluation of a hand laceration.  Physical exam with a 4 cm laceration over the thenar eminence that is overall superficial but does have a significant amount of oozing.  No pulsatile bleeding noted.  Given superficial nature of the laceration via mandolin, low suspicion for underlying fracture or retained foreign body and thus x-ray imaging deferred.  Pneumatic tourniquet applied to the bicep via blood pressure cuff creating a bloodless field.  The wound was copiously cleaned and there does not appear to be in the underlying tendon damage or retained foreign body.  Laceration was repaired with Prolene suture combination of horizontal mattress and simple interrupted sutures.  Upon taking down the pneumatic tourniquet, no additional bleeding noted.  Tetanus was updated and patient will require follow-up with outpatient hand surgery in 1 week for suture removal.  Patient given return precautions of which he voiced understanding he was discharged   Additional history obtained: -Additional history obtained from son -External records from outside source obtained and reviewed including: Chart review including previous notes, labs, imaging, consultation notes   Medicines ordered and prescription drug management: Meds ordered this encounter  Medications   Tdap (BOOSTRIX) injection 0.5 mL    -I have reviewed the patients home medicines and have made adjustments as needed  Critical interventions none    Cardiac Monitoring: The patient was maintained on a cardiac monitor.  I personally viewed and interpreted the cardiac monitored which showed an underlying rhythm of: NSR  Social Determinants of Health:  Factors impacting patients care include: none   Reevaluation: After the interventions noted above, I reevaluated the patient and  found that they have :improved  Co morbidities that complicate the patient evaluation  Past Medical History:  Diagnosis Date   Arthritis    left arm    Asthma  Benign localized prostatic hyperplasia with lower urinary tract symptoms (LUTS)    Full dentures    GERD (gastroesophageal reflux disease)    History of adenomatous polyp of colon    History of kidney stones    Hyperlipidemia    Moderate asthma without complication    followed by pcp--- (11-06-2020 pt stated last exacerbation several years , well controlled with daily qvar inhaler)   Pre-diabetes    S/P balloon dilatation of esophageal stricture    followed by dr Marca Ancona (GI)---  [id number];  01/ 2020;  11/ 2021   Wears glasses       Dispostion: I considered admission for this patient, but with wound repaired patient does not meet inpatient criteria for admission he is safe for discharge with outpatient follow-up     Final Clinical Impression(s) / ED Diagnoses Final diagnoses:  Laceration of left hand, foreign body presence unspecified, initial encounter     @PCDICTATION @    Glendora Score, MD 12/22/22 1320

## 2023-07-08 ENCOUNTER — Emergency Department (HOSPITAL_COMMUNITY): Payer: 59

## 2023-07-08 ENCOUNTER — Telehealth: Payer: Self-pay

## 2023-07-08 ENCOUNTER — Other Ambulatory Visit: Payer: Self-pay

## 2023-07-08 ENCOUNTER — Telehealth: Payer: Self-pay | Admitting: Neurology

## 2023-07-08 ENCOUNTER — Emergency Department (HOSPITAL_COMMUNITY)
Admission: EM | Admit: 2023-07-08 | Discharge: 2023-07-08 | Disposition: A | Discharge status: Home / Self Care | Payer: 59 | Attending: Emergency Medicine | Admitting: Emergency Medicine

## 2023-07-08 DIAGNOSIS — Z7982 Long term (current) use of aspirin: Secondary | ICD-10-CM | POA: Diagnosis not present

## 2023-07-08 DIAGNOSIS — R29818 Other symptoms and signs involving the nervous system: Secondary | ICD-10-CM | POA: Diagnosis not present

## 2023-07-08 DIAGNOSIS — Z91010 Allergy to peanuts: Secondary | ICD-10-CM | POA: Insufficient documentation

## 2023-07-08 DIAGNOSIS — R519 Headache, unspecified: Secondary | ICD-10-CM | POA: Diagnosis present

## 2023-07-08 LAB — COMPREHENSIVE METABOLIC PANEL
ALT: 17 U/L (ref 0–44)
AST: 20 U/L (ref 15–41)
Albumin: 4.4 g/dL (ref 3.5–5.0)
Alkaline Phosphatase: 68 U/L (ref 38–126)
Anion gap: 10 (ref 5–15)
BUN: 19 mg/dL (ref 8–23)
CO2: 25 mmol/L (ref 22–32)
Calcium: 9.2 mg/dL (ref 8.9–10.3)
Chloride: 103 mmol/L (ref 98–111)
Creatinine, Ser: 0.76 mg/dL (ref 0.61–1.24)
GFR, Estimated: >60 mL/min (ref >60)
Glucose, Bld: 105 mg/dL — ABNORMAL HIGH (ref 70–99)
Potassium: 3.5 mmol/L (ref 3.5–5.1)
Sodium: 138 mmol/L (ref 135–145)
Total Bilirubin: 1 mg/dL (ref <1.2)
Total Protein: 8 g/dL (ref 6.5–8.1)

## 2023-07-08 LAB — CBC WITH DIFFERENTIAL/PLATELET
Abs Immature Granulocytes: 0.02 10*3/uL (ref 0.00–0.07)
Basophils Absolute: 0 10*3/uL (ref 0.0–0.1)
Basophils Relative: 0 %
Eosinophils Absolute: 0.2 10*3/uL (ref 0.0–0.5)
Eosinophils Relative: 3 %
HCT: 46.4 % (ref 39.0–52.0)
Hemoglobin: 15.1 g/dL (ref 13.0–17.0)
Immature Granulocytes: 0 %
Lymphocytes Relative: 15 %
Lymphs Abs: 0.8 10*3/uL (ref 0.7–4.0)
MCH: 29.8 pg (ref 26.0–34.0)
MCHC: 32.5 g/dL (ref 30.0–36.0)
MCV: 91.7 fL (ref 80.0–100.0)
Monocytes Absolute: 0.6 10*3/uL (ref 0.1–1.0)
Monocytes Relative: 11 %
Neutro Abs: 3.9 10*3/uL (ref 1.7–7.7)
Neutrophils Relative %: 71 %
Platelets: 232 10*3/uL (ref 150–400)
RBC: 5.06 MIL/uL (ref 4.22–5.81)
RDW: 12.3 % (ref 11.5–15.5)
WBC: 5.6 10*3/uL (ref 4.0–10.5)
nRBC: 0 % (ref 0.0–0.2)

## 2023-07-08 MED ORDER — DIPHENHYDRAMINE HCL 50 MG/ML IJ SOLN
25.0000 mg | Freq: Once | INTRAMUSCULAR | Status: DC
Start: 2023-07-08 — End: 2023-07-08
  Filled 2023-07-08: qty 1

## 2023-07-08 MED ORDER — METOCLOPRAMIDE HCL 5 MG/ML IJ SOLN
10.0000 mg | Freq: Once | INTRAMUSCULAR | Status: DC
  Filled 2023-07-08: qty 2

## 2023-07-08 MED ORDER — SODIUM CHLORIDE 0.9 % IV BOLUS
1000.0000 mL | Freq: Once | INTRAVENOUS | Status: DC
Start: 2023-07-08 — End: 2023-07-08

## 2023-07-08 MED ORDER — LORAZEPAM 2 MG/ML IJ SOLN
2.0000 mg | Freq: Once | INTRAMUSCULAR | Status: DC
  Filled 2023-07-08: qty 1

## 2023-07-08 MED ORDER — LORAZEPAM 2 MG/ML IJ SOLN
1.0000 mg | Freq: Once | INTRAMUSCULAR | Status: DC
Start: 2023-07-08 — End: 2023-07-08

## 2023-07-08 NOTE — Telephone Encounter (Signed)
Pt's wife called in stating the pt's tremors have gotten worse. She stated about 3 weeks ago he started not being able to get his words out and it's getting worse. She is worried he may have had a stroke.

## 2023-07-08 NOTE — Telephone Encounter (Signed)
Error

## 2023-07-08 NOTE — Discharge Instructions (Addendum)
Please follow-up with your primary care doctor, and Dr. Arbutus Leas.  Your MRI is reassuring today and there is no evidence of a stroke.  Return to the ER if you feel like your symptoms are worsening.

## 2023-07-08 NOTE — ED Triage Notes (Signed)
Patient has had tremors for 3 years, hand jerks for 8 months, unable to get words out for the last 3 weeks. Wife said it takes patient a lot of time to get his words out now. Wife called neurologist who sent him here for possible stroke. Complaining of left sided head pain and left eye pain.

## 2023-07-08 NOTE — ED Provider Notes (Signed)
[place] EMERGENCY DEPARTMENT AT [facility] Provider Note   CSN: [id number] Arrival date & time: 07/08/23  1002     History  Chief Complaint  Patient presents with   Headache    Jose Hartman is a 62 y.o. male, history of functional neurologic disorder, who presents to the ED secondary to headache that is been going on for the last 8 days, that starts on the left side of his head, and goes down the back of his head.  He states that it is stabbing, and just hurts.  Denies any photophobia, phonophobia, vision changes, or weakness of 1 side of the body.  States he takes migraine medication, but has not taken in a while.  Sees Dr. Arbutus Leas, his neurologist.  Also is here for worsening tremors, difficulty with speech, and decreased reactive Enis, per wife for the last 3 weeks.  Wife says it has slowly come on, and is just progressively gotten worse.  They called the neurologist today and was sent to the ER, for an MRI to rule out a stroke.    Home Medications Prior to Admission medications   Medication Sig Start Date End Date Taking? Authorizing Provider  albuterol (VENTOLIN HFA) 108 (90 Base) MCG/ACT inhaler Inhale 1-2 puffs into the lungs every 6 (six) hours as needed. 03/05/20   [provider]  aspirin EC 81 MG tablet Take 81 mg by mouth daily.    [provider]  b complex vitamins capsule Take 1 capsule by mouth daily.    [provider]  carbidopa-levodopa (SINEMET IR) 25-100 MG tablet 1 three times per day at 8am/noon/4pm as directed to pt 10/07/21   Tat, Lurena Joiner S, DO  Carbidopa-Levodopa ER (SINEMET CR) 25-100 MG tablet controlled release Take 1 tablet by mouth at bedtime. 11/07/21   Tat, Octaviano Batty, DO  Docusate Sodium (DSS) 100 MG CAPS Take 1 capsule by mouth 3 (three) times daily.    [provider]  mometasone-formoterol (DULERA) 200-5 MCG/ACT AERO Inhale 2 puffs into the lungs 2 (two) times daily.    [provider]   pantoprazole (PROTONIX) 40 MG tablet Take 40 mg by mouth 2 (two) times daily. 11/23/19   [provider]  pravastatin (PRAVACHOL) 40 MG tablet Take 40 mg by mouth at bedtime. 08/03/20   [provider]  primidone (MYSOLINE) 50 MG tablet Take 50 mg by mouth daily. 09/24/21   [provider]  QVAR REDIHALER 80 MCG/ACT inhaler Inhale 1 puff into the lungs 2 (two) times daily. 09/04/20   [provider]  sucralfate (CARAFATE) 1 g tablet Take 1 g by mouth 2 (two) times daily.    [provider]  tamsulosin (FLOMAX) 0.4 MG CAPS capsule Take 0.8 mg by mouth at bedtime. 08/29/20   [provider]  traZODone (DESYREL) 50 MG tablet Take 50 mg by mouth at bedtime.    [provider]  triamcinolone cream (KENALOG) 0.1 % Apply 1 Application topically 2 (two) times daily. 04/09/22   Blane Ohara, MD      Allergies    Benadryl [diphenhydramine hcl] and Peanuts [peanut oil]    Review of Systems   Review of Systems  Neurological:  Positive for headaches.    Physical Exam Updated Vital Signs BP 125/78   Pulse 69   Temp 98.4 F (36.9 C) (Oral)   Resp 16   Ht 5\' 7"  (1.702 m)   Wt 59.9 kg   SpO2 96%   BMI  20.67 kg/m  Physical Exam Vitals and nursing note reviewed.  Constitutional:      General: He is not in acute distress.    Appearance: He is well-developed.  HENT:     Head: Normocephalic and atraumatic.  Eyes:     Conjunctiva/sclera: Conjunctivae normal.  Cardiovascular:     Rate and Rhythm: Normal rate and regular rhythm.     Heart sounds: No murmur heard. Pulmonary:     Effort: Pulmonary effort is normal. No respiratory distress.     Breath sounds: Normal breath sounds.  Abdominal:     Palpations: Abdomen is soft.     Tenderness: There is no abdominal tenderness.  Musculoskeletal:        General: No swelling.     Cervical back: Neck supple.  Skin:    General: Skin is warm and dry.     Capillary Refill: Capillary refill  takes less than 2 seconds.  Neurological:     Mental Status: He is alert.     Sensory: Sensation is intact.     Motor: Tremor present.     Comments: Tremulous, difficult to further evaluate given patient's severe tremors.  Generalized weakness 4 out of 5 bilateral upper and lower extremities.  Sensation intact.  Impaired coordination secondary to tremor  Psychiatric:        Mood and Affect: Mood normal.     ED Results / Procedures / Treatments   Labs (all labs ordered are listed, but only abnormal results are displayed) Labs Reviewed  COMPREHENSIVE METABOLIC PANEL - Abnormal; Notable for the following components:      Result Value   Glucose, Bld 105 (*)    All other components within normal limits  CBC WITH DIFFERENTIAL/PLATELET  URINALYSIS, ROUTINE W REFLEX MICROSCOPIC    EKG None  Radiology MR BRAIN WO CONTRAST  Result Date: 07/08/2023 CLINICAL DATA:  Transient ischemic attack. EXAM: MRI HEAD WITHOUT CONTRAST TECHNIQUE: Multiplanar, multiecho pulse sequences of the brain and surrounding structures were obtained without intravenous contrast. COMPARISON:  Head CT from earlier today FINDINGS: Brain: No acute infarction, hemorrhage, hydrocephalus, extra-axial collection or mass lesion. Avari Gelles FLAIR hyperintensities in the cerebral white matter attributed to chronic [place] vessel ischemia, mild in extent. Age normal brain volume. Vascular: Major flow voids are preserved. Skull and upper cervical spine: No focal marrow lesion. Sinuses/Orbits: Prior endoscopic sinus surgery with generalized mild mucosal thickening in the paranasal sinuses. Negative orbits. IMPRESSION: No acute finding or explanation for symptoms. Electronically Signed   By: Tiburcio Pea M.D.   On: 07/08/2023 13:03   DG Chest 2 View  Result Date: 07/08/2023 CLINICAL DATA:  Memory loss, tremors. EXAM: CHEST - 2 VIEW COMPARISON:  November 22, 2015. FINDINGS: The heart size and mediastinal contours are within normal limits.  Both lungs are clear. The visualized skeletal structures are unremarkable. IMPRESSION: No active cardiopulmonary disease. Electronically Signed   By: Lupita Raider M.D.   On: 07/08/2023 12:41   CT Head Wo Contrast  Result Date: 07/08/2023 CLINICAL DATA:  Transient ischemic attack. Unable to get out words for 3 weeks. EXAM: CT HEAD WITHOUT CONTRAST TECHNIQUE: Contiguous axial images were obtained from the base of the skull through the vertex without intravenous contrast. RADIATION DOSE REDUCTION: This exam was performed according to the departmental dose-optimization program which includes automated exposure control, adjustment of the mA and/or kV according to patient size and/or use of iterative reconstruction technique. COMPARISON:  10/31/2019 FINDINGS: Brain: No evidence of acute infarction, hemorrhage, hydrocephalus,  extra-axial collection or mass lesion/mass effect. Vascular: No hyperdense vessel or unexpected calcification. Skull: Normal. Negative for fracture or focal lesion. Sinuses/Orbits: Prior endoscopic sinus surgery. Mild generalized mucosal thickening in the sinuses. Negative orbits. IMPRESSION: No explanation for symptoms.  No acute or subacute insult. Electronically Signed   By: Tiburcio Pea M.D.   On: 07/08/2023 11:13    Procedures Procedures    Medications Ordered in ED Medications - No data to display  ED Course/ Medical Decision Making/ A&P                                 Medical Decision Making Patient is a 62 year old male, here with a headache, as well as neuro disorder, that has progressively been getting worse, over the last 3 weeks.  Headaches been going on for the last 8 days.  He has no neurodeficits on exam, however is very difficult to tell as he has a severe tremor.  Tremor gets worse, as he engages with me.  We will obtain a head CT, for start, as well as obtain blood work, for invoking factors.  He has no focal weakness.  Amount and/or Complexity of Data  Reviewed Labs: ordered.    Details: Unremarkable blood work Radiology: ordered.    Details: CT head and MRI unremarkable Discussion of management or test interpretation with external provider(s): Discussed CT head with Dr. Wilford Corner, It is unremarkable, he recommends MRI, for further evaluation, to determine if patient had a stroke or not, and if negative follow-up with neurology.  I discussed with the patient, his MRI was unremarkable, he has no acute findings.  He also states that his headache is now resolved, and he is feeling better.  I discussed this with the wife, and they will follow-up with Dr. Arbutus Leas, for further evaluation.  This may be secondary to his functional neurologic disorder.  Risk Prescription drug management.    Final Clinical Impression(s) / ED Diagnoses Final diagnoses:  Neurologic abnormality  Acute nonintractable headache, unspecified headache type    Rx / DC Orders ED Discharge Orders     None         Verley Pariseau, Harley [place], PA 07/08/23 1347    Bethann Berkshire, MD 07/13/23 1026

## 2023-07-08 NOTE — Telephone Encounter (Signed)
Spoke to patients wife she called the PCP and they told her to call our office. I told wife that patient needed to go to the urgent care or ED right away if patient is having mini strokes which he has had in the past he needs to be seen today right away. Patients wife said his speech has changed , severe head jerking extremely painful headaches and he is unable to speak and get his thoughts and words out. She agreed and is taking patient right now to get examined

## 2023-07-08 NOTE — ED Notes (Signed)
Unable to obtain urine from pt at this time. Will ask again after MRI.

## 2023-07-20 IMAGING — NM NM DATSCAN
2 series · 12 of 12 positions shown · non-contrast
Comparison: Head CT 10/31/2019

CLINICAL DATA: 60-year-old male with RIGHT arm and hand tremors.

EXAM:
NUCLEAR MEDICINE BRAIN IMAGING WITH SPECT  (DaTscan )
TECHNIQUE: SPECT images of the brain were obtained after intravenous injection
of radiopharmaceutical. 4 hour post injection imaging. Appropriate
positioning.
130 mg DAHIR STAT given orally for thyroid blockade.
RADIOPHARMACEUTICALS:  4.0 millicuries I 123 Ioflupane

[Series 1: spect - 159 kev _(id)_tra · 4.1mm · 4.14mm/px · 6 of 128 frames shown]
[frame 11/128]
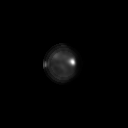
[frame 32/128]
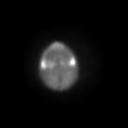
[frame 54/128]
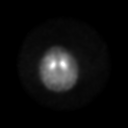
[frame 75/128]
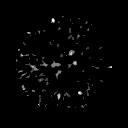
[frame 96/128]
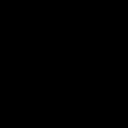
[frame 118/128]
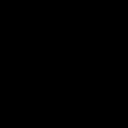

[Series 1: spect - 159 kev _(id)_cor · 4.1mm · 4.14mm/px · 6 of 128 frames shown]
[frame 11/128]
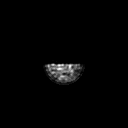
[frame 32/128]
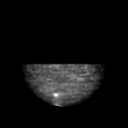
[frame 54/128]
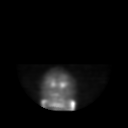
[frame 75/128]
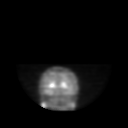
[frame 96/128]
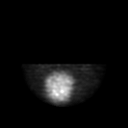
[frame 118/128]
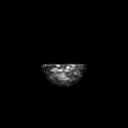

[12 of 12 positions shown; findings below may reference images not displayed]

FINDINGS: Symmetric intense uptake within LEFT and RIGHT striata. The heads of
the caudate nuclei and the posterior striata (putamen) are normal
shape. No evidence of loss of dopamine transport populations in the
basal ganglia.
IMPRESSION: Normal Ioflupane scan. No reduced radiotracer activity in basal
ganglia to suggest Parkinson's syndrome pathology.

Of note, DaTSCAN is not diagnostic of Parkinsonian syndromes, which
remains a clinical diagnosis. DaTscan is an adjuvant test to aid in
the clinical diagnosis of Parkinsonian syndromes.

## 2023-07-21 ENCOUNTER — Ambulatory Visit: Payer: 59 | Admitting: Neurology

## 2023-11-11 ENCOUNTER — Emergency Department (EMERGENCY_DEPARTMENT_HOSPITAL): Admitting: Certified Registered Nurse Anesthetist

## 2023-11-11 ENCOUNTER — Emergency Department (HOSPITAL_COMMUNITY): Admitting: Certified Registered Nurse Anesthetist

## 2023-11-11 ENCOUNTER — Emergency Department (HOSPITAL_COMMUNITY)
Admission: EM | Admit: 2023-11-11 | Discharge: 2023-11-11 | Disposition: A | Discharge status: Home / Self Care | Attending: Gastroenterology | Admitting: Gastroenterology

## 2023-11-11 ENCOUNTER — Encounter (HOSPITAL_COMMUNITY): Payer: Self-pay | Admitting: Emergency Medicine

## 2023-11-11 ENCOUNTER — Other Ambulatory Visit: Payer: Self-pay

## 2023-11-11 ENCOUNTER — Encounter (HOSPITAL_COMMUNITY)
Admission: EM | Disposition: A | Discharge status: Home / Self Care | Payer: Self-pay | Source: Home / Self Care | Attending: Emergency Medicine

## 2023-11-11 DIAGNOSIS — K222 Esophageal obstruction: Secondary | ICD-10-CM | POA: Diagnosis not present

## 2023-11-11 DIAGNOSIS — Z7951 Long term (current) use of inhaled steroids: Secondary | ICD-10-CM | POA: Insufficient documentation

## 2023-11-11 DIAGNOSIS — M1990 Unspecified osteoarthritis, unspecified site: Secondary | ICD-10-CM | POA: Insufficient documentation

## 2023-11-11 DIAGNOSIS — T18128A Food in esophagus causing other injury, initial encounter: Secondary | ICD-10-CM | POA: Insufficient documentation

## 2023-11-11 DIAGNOSIS — Z79899 Other long term (current) drug therapy: Secondary | ICD-10-CM | POA: Diagnosis not present

## 2023-11-11 DIAGNOSIS — J45909 Unspecified asthma, uncomplicated: Secondary | ICD-10-CM | POA: Insufficient documentation

## 2023-11-11 DIAGNOSIS — K219 Gastro-esophageal reflux disease without esophagitis: Secondary | ICD-10-CM | POA: Diagnosis not present

## 2023-11-11 DIAGNOSIS — Z7982 Long term (current) use of aspirin: Secondary | ICD-10-CM | POA: Insufficient documentation

## 2023-11-11 DIAGNOSIS — K224 Dyskinesia of esophagus: Secondary | ICD-10-CM | POA: Diagnosis not present

## 2023-11-11 DIAGNOSIS — W44F3XA Food entering into or through a natural orifice, initial encounter: Secondary | ICD-10-CM | POA: Insufficient documentation

## 2023-11-11 DIAGNOSIS — R1310 Dysphagia, unspecified: Secondary | ICD-10-CM | POA: Diagnosis present

## 2023-11-11 DIAGNOSIS — K2211 Ulcer of esophagus with bleeding: Secondary | ICD-10-CM | POA: Insufficient documentation

## 2023-11-11 HISTORY — PX: ESOPHAGOGASTRODUODENOSCOPY: SHX5428

## 2023-11-11 HISTORY — PX: ESOPHAGEAL DILATION: SHX303

## 2023-11-11 HISTORY — DX: Tremor, unspecified: R25.1

## 2023-11-11 LAB — COMPREHENSIVE METABOLIC PANEL WITH GFR
ALT: 24 U/L (ref 0–44)
AST: 22 U/L (ref 15–41)
Albumin: 3.6 g/dL (ref 3.5–5.0)
Alkaline Phosphatase: 56 U/L (ref 38–126)
Anion gap: 8 (ref 5–15)
BUN: 10 mg/dL (ref 8–23)
CO2: 26 mmol/L (ref 22–32)
Calcium: 8.9 mg/dL (ref 8.9–10.3)
Chloride: 107 mmol/L (ref 98–111)
Creatinine, Ser: 0.83 mg/dL (ref 0.61–1.24)
GFR, Estimated: >60 mL/min (ref >60)
Glucose, Bld: 107 mg/dL — ABNORMAL HIGH (ref 70–99)
Potassium: 4 mmol/L (ref 3.5–5.1)
Sodium: 141 mmol/L (ref 135–145)
Total Bilirubin: 0.6 mg/dL (ref 0.0–1.2)
Total Protein: 6.9 g/dL (ref 6.5–8.1)

## 2023-11-11 LAB — CBC
HCT: 45.8 % (ref 39.0–52.0)
Hemoglobin: 14.8 g/dL (ref 13.0–17.0)
MCH: 30.3 pg (ref 26.0–34.0)
MCHC: 32.3 g/dL (ref 30.0–36.0)
MCV: 93.7 fL (ref 80.0–100.0)
Platelets: 240 10*3/uL (ref 150–400)
RBC: 4.89 MIL/uL (ref 4.22–5.81)
RDW: 13.1 % (ref 11.5–15.5)
WBC: 5.8 10*3/uL (ref 4.0–10.5)
nRBC: 0 % (ref 0.0–0.2)

## 2023-11-11 SURGERY — EGD (ESOPHAGOGASTRODUODENOSCOPY)
Anesthesia: General

## 2023-11-11 MED ORDER — ACETAMINOPHEN 10 MG/ML IV SOLN: 1000.0000 mg | Freq: Once | INTRAVENOUS | Status: DC | PRN

## 2023-11-11 MED ORDER — SUCCINYLCHOLINE CHLORIDE 200 MG/10ML IV SOSY
PREFILLED_SYRINGE | INTRAVENOUS | Status: DC | PRN
  Administered 2023-11-11: 100 mg via INTRAVENOUS

## 2023-11-11 MED ORDER — DEXAMETHASONE SODIUM PHOSPHATE 10 MG/ML IJ SOLN
INTRAMUSCULAR | Status: DC | PRN
  Administered 2023-11-11: 5 mg via INTRAVENOUS

## 2023-11-11 MED ORDER — SODIUM CHLORIDE 0.9 % IV SOLN
INTRAVENOUS | Status: AC | PRN
  Administered 2023-11-11: 500 mL via INTRAVENOUS

## 2023-11-11 MED ORDER — MIDAZOLAM HCL 2 MG/2ML IJ SOLN
INTRAMUSCULAR | Status: DC | PRN
  Administered 2023-11-11: 1 mg via INTRAVENOUS

## 2023-11-11 MED ORDER — ONDANSETRON HCL 4 MG/2ML IJ SOLN: 4.0000 mg | Freq: Once | INTRAMUSCULAR | Status: DC | PRN

## 2023-11-11 MED ORDER — FENTANYL CITRATE (PF) 100 MCG/2ML IJ SOLN: 25.0000 ug | INTRAMUSCULAR | Status: DC | PRN

## 2023-11-11 MED ORDER — LIDOCAINE 2% (20 MG/ML) 5 ML SYRINGE
INTRAMUSCULAR | Status: DC | PRN
  Administered 2023-11-11: 100 mg via INTRAVENOUS

## 2023-11-11 MED ORDER — EPHEDRINE SULFATE-NACL 50-0.9 MG/10ML-% IV SOSY
PREFILLED_SYRINGE | INTRAVENOUS | Status: DC | PRN
  Administered 2023-11-11: 5 mg via INTRAVENOUS
  Administered 2023-11-11: 10 mg via INTRAVENOUS

## 2023-11-11 MED ORDER — FENTANYL CITRATE (PF) 100 MCG/2ML IJ SOLN
INTRAMUSCULAR | Status: AC
  Filled 2023-11-11: qty 2

## 2023-11-11 MED ORDER — MIDAZOLAM HCL 2 MG/2ML IJ SOLN
INTRAMUSCULAR | Status: AC
  Filled 2023-11-11: qty 2

## 2023-11-11 MED ORDER — ONDANSETRON HCL 4 MG/2ML IJ SOLN
INTRAMUSCULAR | Status: DC | PRN
  Administered 2023-11-11: 4 mg via INTRAVENOUS

## 2023-11-11 MED ORDER — OXYCODONE HCL 5 MG PO TABS: 5.0000 mg | ORAL_TABLET | Freq: Once | ORAL | Status: DC | PRN

## 2023-11-11 MED ORDER — PROPOFOL 10 MG/ML IV BOLUS
INTRAVENOUS | Status: DC | PRN
Start: 2023-11-11 — End: 2023-11-11
  Administered 2023-11-11: 120 mg via INTRAVENOUS

## 2023-11-11 MED ORDER — OXYCODONE HCL 5 MG/5ML PO SOLN: 5.0000 mg | Freq: Once | ORAL | Status: DC | PRN

## 2023-11-11 MED ORDER — FENTANYL CITRATE (PF) 250 MCG/5ML IJ SOLN
INTRAMUSCULAR | Status: DC | PRN
  Administered 2023-11-11: 50 ug via INTRAVENOUS

## 2023-11-11 MED ORDER — PHENYLEPHRINE 80 MCG/ML (10ML) SYRINGE FOR IV PUSH (FOR BLOOD PRESSURE SUPPORT)
PREFILLED_SYRINGE | INTRAVENOUS | Status: DC | PRN
  Administered 2023-11-11: 160 ug via INTRAVENOUS
  Administered 2023-11-11: 80 ug via INTRAVENOUS

## 2023-11-11 NOTE — ED Provider Notes (Signed)
 [facility] AT [facility] Provider Note   CSN: [id number] Arrival date & time: 11/11/23  4098     History  Chief Complaint  Patient presents with   Hematemesis   food bolus    Jose Hartman is a 63 y.o. male.  63 year old male with prior medical history as detailed below presents for evaluation.  Patient reports longstanding history of esophageal stricture and multiple prior episodes of esophageal food impaction and esophageal dilatation.  He is known to Dr. Marca Ancona, with [facility].  Patient reports that he had a meal including chicken at around 3 PM.  He felt this meal get hung up.  He has been unable to swallow any secretions since.  He did throw up once.  He reports that he saw some bloody fluid in the emesis after that 1 episode of vomiting.  He denies recurrent vomiting.  He called [facility] office and was advised to come to the ED for evaluation.  The history is provided by the patient.       Home Medications Prior to Admission medications   Medication Sig Start Date End Date Taking? Authorizing Provider  albuterol (VENTOLIN HFA) 108 (90 Base) MCG/ACT inhaler Inhale 1-2 puffs into the lungs every 6 (six) hours as needed. 03/05/20   [provider]  aspirin EC 81 MG tablet Take 81 mg by mouth daily.    [provider]  b complex vitamins capsule Take 1 capsule by mouth daily.    [provider]  carbidopa-levodopa (SINEMET IR) 25-100 MG tablet 1 three times per day at 8am/noon/4pm as directed to pt 10/07/21   Tat, Lurena Joiner S, DO  Carbidopa-Levodopa ER (SINEMET CR) 25-100 MG tablet controlled release Take 1 tablet by mouth at bedtime. 11/07/21   Tat, Octaviano Batty, DO  Docusate Sodium (DSS) 100 MG CAPS Take 1 capsule by mouth 3 (three) times daily.    [provider]  mometasone-formoterol (DULERA) 200-5 MCG/ACT AERO Inhale 2 puffs into the lungs 2 (two) times daily.    [provider]  pantoprazole  (PROTONIX) 40 MG tablet Take 40 mg by mouth 2 (two) times daily. 11/23/19   [provider]  pravastatin (PRAVACHOL) 40 MG tablet Take 40 mg by mouth at bedtime. 08/03/20   [provider]  primidone (MYSOLINE) 50 MG tablet Take 50 mg by mouth daily. 09/24/21   [provider]  QVAR REDIHALER 80 MCG/ACT inhaler Inhale 1 puff into the lungs 2 (two) times daily. 09/04/20   [provider]  sucralfate (CARAFATE) 1 g tablet Take 1 g by mouth 2 (two) times daily.    [provider]  tamsulosin (FLOMAX) 0.4 MG CAPS capsule Take 0.8 mg by mouth at bedtime. 08/29/20   [provider]  traZODone (DESYREL) 50 MG tablet Take 50 mg by mouth at bedtime.    [provider]  triamcinolone cream (KENALOG) 0.1 % Apply 1 Application topically 2 (two) times daily. 04/09/22   Blane Ohara, MD      Allergies    Benadryl [diphenhydramine hcl] and Peanuts [peanut oil]    Review of Systems   Review of Systems  All other systems reviewed and are negative.   Physical Exam Updated Vital Signs BP 122/72   Pulse 77   Temp 98.4 F (36.9 C) (Oral)   Resp 16   Ht 5\' 7"  (1.702 m)   Wt 59 kg   SpO2 95%   BMI 20.37 kg/m  Physical  Exam Vitals and nursing note reviewed.  Constitutional:      General: He is not in acute distress.    Appearance: Normal appearance. He is well-developed.  HENT:     Head: Normocephalic and atraumatic.  Eyes:     Conjunctiva/sclera: Conjunctivae normal.     Pupils: Pupils are equal, round, and reactive to light.  Cardiovascular:     Rate and Rhythm: Normal rate and regular rhythm.     Heart sounds: Normal heart sounds.  Pulmonary:     Effort: Pulmonary effort is normal. No respiratory distress.     Breath sounds: Normal breath sounds.  Abdominal:     General: There is no distension.     Palpations: Abdomen is soft.     Tenderness: There is no abdominal tenderness.  Musculoskeletal:        General: No deformity.  Normal range of motion.     Cervical back: Normal range of motion and neck supple.  Skin:    General: Skin is warm and dry.  Neurological:     General: No focal deficit present.     Mental Status: He is alert and oriented to person, place, and time.     ED Results / Procedures / Treatments   Labs (all labs ordered are listed, but only abnormal results are displayed) Labs Reviewed  CBC  COMPREHENSIVE METABOLIC PANEL WITH GFR  TYPE AND SCREEN    EKG None  Radiology No results found.  Procedures Procedures    Medications Ordered in ED Medications - No data to display  ED Course/ Medical Decision Making/ A&P                                 Medical Decision Making Amount and/or Complexity of Data Reviewed Labs: ordered.  Risk Decision regarding hospitalization.    Medical Screen Complete  This patient presented to the ED with complaint of suspected esophageal foreign body/food impaction.  This complaint involves an extensive number of treatment options. The initial differential diagnosis includes, but is not limited to, esophageal food impaction  This presentation is: Acute, Chronic, Self-Limited, Previously Undiagnosed, Uncertain Prognosis, Complicated, and Systemic Symptoms  Patient presents after ingesting a meal at around 3 PM.  He reports that this meal including chicken.  He felt the meal get hung up in his esophagus.  He reports prior history of esophageal stricture and multiple prior episodes of dilatation.  He reports prior history of esophageal foreign body as well.  He contacted Dr. Marca Ancona, [facility], and was advised to come to the ED.  Dr. Ewing Schlein, oncall for [facility] paged at 1715. He will plan on scoping patient today.     Co morbidities that complicated the patient's evaluation  See HPI   Additional history obtained:  External records from outside sources obtained and reviewed including prior ED visits and prior Inpatient records.  Problem  List / ED Course:  Esophageal Food Impaction  Disposition:  After consideration of the diagnostic results and the patients response to treatment, I feel that the patent would benefit from admission.          Final Clinical Impression(s) / ED Diagnoses Final diagnoses:  Esophageal obstruction due to food impaction    Rx / DC Orders ED Discharge Orders     None         Wynetta Fines, MD 11/11/23 [phone]

## 2023-11-11 NOTE — Anesthesia Procedure Notes (Signed)
 Procedure Name: Intubation Date/Time: 11/11/2023 7:15 PM  Performed by: Dairl Ponder, CRNAPre-anesthesia Checklist: Patient identified, Emergency Drugs available, Suction available and Patient being monitored Patient Re-evaluated:Patient Re-evaluated prior to induction Oxygen Delivery Method: Circle System Utilized Preoxygenation: Pre-oxygenation with 100% oxygen Induction Type: IV induction, Rapid sequence and Cricoid Pressure applied Ventilation: Mask ventilation without difficulty Laryngoscope Size: Mac and 3 Grade View: Grade I Tube type: Oral Tube size: 7.0 mm Number of attempts: 1 Airway Equipment and Method: Stylet and Oral airway Placement Confirmation: ETT inserted through vocal cords under direct vision, positive ETCO2 and breath sounds checked- equal and bilateral Secured at: 23 cm Tube secured with: Tape Dental Injury: Teeth and Oropharynx as per pre-operative assessment

## 2023-11-11 NOTE — Transfer of Care (Signed)
 Immediate Anesthesia Transfer of Care Note  Patient: Jose Hartman  Procedure(s) Performed: EGD (ESOPHAGOGASTRODUODENOSCOPY) DILATION, ESOPHAGUS  Patient Location: PACU  Anesthesia Type:General  Level of Consciousness: awake  Airway & Oxygen Therapy: Patient Spontanous Breathing and Patient connected to face mask oxygen  Post-op Assessment: Report given to RN and Post -op Vital signs reviewed and stable  Post vital signs: Reviewed and stable  Last Vitals:  Vitals Value Taken Time  BP 103/67 11/11/23 1936  Temp    Pulse 71 11/11/23 1939  Resp    SpO2 100 % 11/11/23 1939  Vitals shown include unfiled device data.  Last Pain:  Vitals:   11/11/23 1842  TempSrc: Temporal  PainSc: 7          Complications: No notable events documented.

## 2023-11-11 NOTE — ED Triage Notes (Signed)
 Patient choked on a piece of chicken and is now throwing up blood. He has history of esophageal issues. His MD told him to come here. He still believes the chicken is stuck.

## 2023-11-11 NOTE — Consult Note (Signed)
 Reason for Consult: Food impaction self-limited hematemesis Referring Physician: ER physician  Jose Hartman is an 63 y.o. male.  HPI: Patient seen and examined and his hospital computer chart in our office computer chart was reviewed and his case discussed with the ER physician and he has a long history of dysphagia with multiple food impactions and dilations and he was referred to [facility] and they are contemplating surgical options versus endoscopic options and he has had a few tests and there since he has been referred but nothing invasive and earlier today while eating chicken fell to get caught in and trying to force himself to vomit he did throw up a little blood but otherwise has been fine however he still feels like the food is caught cannot swallow anything and has no other complaints  Past Medical History:  Diagnosis Date   Arthritis    left arm    Asthma    Benign localized prostatic hyperplasia with lower urinary tract symptoms (LUTS)    Full dentures    GERD (gastroesophageal reflux disease)    History of adenomatous polyp of colon    History of kidney stones    Hyperlipidemia    Moderate asthma without complication    followed by pcp--- (11-06-2020 pt stated last exacerbation several years , well controlled with daily qvar inhaler)   Pre-diabetes    S/P balloon dilatation of esophageal stricture    followed by dr Marca Ancona (GI)---  [id number];  01/ 2020;  11/ 2021   Tremor    Wears glasses     Past Surgical History:  Procedure Laterality Date   BALLOON DILATION N/A 08/13/2018   Procedure: BALLOON DILATION;  Surgeon: Kerin Salen, MD;  Location: [facility];  Service: Gastroenterology;  Laterality: N/A;   COLONOSCOPY N/A 08/13/2018   Procedure: COLONOSCOPY;  Surgeon: Kerin Salen, MD;  Location: [facility];  Service: Gastroenterology;  Laterality: N/A;   CYSTOSCOPY/RETROGRADE/URETEROSCOPY/STONE EXTRACTION WITH BASKET Left 09/09/2013   Procedure: CYSTOSCOPY/LEFT RETROGRADE  PYELOGRAM /FLEXIBLE URETEROSCOPY/INSERTION LEFT URETERAL STENT;  Surgeon: Crist Fat, MD;  Location: [facility];  Service: Urology;  Laterality: Left;   ESOPHAGOGASTRODUODENOSCOPY Left 05/15/2017   Procedure: ESOPHAGOGASTRODUODENOSCOPY (EGD);  Surgeon: Kerin Salen, MD;  Location: [facility];  Service: Gastroenterology;  Laterality: Left;   ESOPHAGOGASTRODUODENOSCOPY N/A 08/13/2018   Procedure: ESOPHAGOGASTRODUODENOSCOPY (EGD);  Surgeon: Kerin Salen, MD;  Location: [facility];  Service: Gastroenterology;  Laterality: N/A;   ESOPHAGOGASTRODUODENOSCOPY (EGD) WITH ESOPHAGEAL DILATION  11/ 2021  dr Marca Ancona   EXTRACORPOREAL SHOCK WAVE LITHOTRIPSY  yrs ago   FRACTURE SURGERY  2002  approx.   left wrist, closed reduction    POLYPECTOMY  08/13/2018   Procedure: POLYPECTOMY;  Surgeon: Kerin Salen, MD;  Location: [facility];  Service: Gastroenterology;;   SEPTOPLASTY WITH ETHMOIDECTOMY, AND MAXILLARY ANTROSTOMY Bilateral 09/14/2018   Procedure: SEPTOPLASTY WITH ETHMOIDECTOMY, AND MAXILLARY ANTROSTOMY,SPHENOIDECTOMY AND TISSUE REMOVAL AND FRONTAL RECESS Lance Coon;  Surgeon: Newman Pies, MD;  Location: [facility];  Service: ENT;  Laterality: Bilateral;   SINUS ENDO WITH FUSION Bilateral 09/14/2018   Procedure: SINUS ENDO WITH FUSION;  Surgeon: Newman Pies, MD;  Location: [facility];  Service: ENT;  Laterality: Bilateral;   TRANSURETHRAL RESECTION OF PROSTATE N/A 11/09/2020   Procedure: TRANSURETHRAL RESECTION OF THE PROSTATE (TURP), BIPOLAR;  Surgeon: Jannifer Hick, MD;  Location: [facility];  Service: Urology;  Laterality: N/A;    Family History  Problem Relation Age of Onset   Atrial fibrillation Mother  Transient ischemic attack Mother    Heart attack Father    Esophageal cancer Brother     Social History:  reports that he has never smoked. His smokeless tobacco use includes chew. He reports current alcohol use. He reports that he does not use drugs.  Allergies:   Allergies  Allergen Reactions   Benadryl [Diphenhydramine Hcl] Other (See Comments)    Causes him to be hyper   Peanuts [Peanut Oil] Hives    Makes ears red and burn     Medications: I have reviewed the patient's current medications.  Results for orders placed or performed during the hospital encounter of 11/11/23 (from the past 48 hours)  Comprehensive metabolic panel     Status: Abnormal   Collection Time: 11/11/23  4:48 PM  Result Value Ref Range   Sodium 141 135 - 145 mmol/L   Potassium 4.0 3.5 - 5.1 mmol/L   Chloride 107 98 - 111 mmol/L   CO2 26 22 - 32 mmol/L   Glucose, Bld 107 (H) 70 - 99 mg/dL    Comment: Glucose reference range applies only to samples taken after fasting for at least 8 hours.   BUN 10 8 - 23 mg/dL   Creatinine, Ser 4.25 0.61 - 1.24 mg/dL   Calcium 8.9 8.9 - 95.6 mg/dL   Total Protein 6.9 6.5 - 8.1 g/dL   Albumin 3.6 3.5 - 5.0 g/dL   AST 22 15 - 41 U/L   ALT 24 0 - 44 U/L   Alkaline Phosphatase 56 38 - 126 U/L   Total Bilirubin 0.6 0.0 - 1.2 mg/dL   GFR, Estimated >38 >75 mL/min    Comment: (NOTE) Calculated using the CKD-EPI Creatinine Equation (2021)    Anion gap 8 5 - 15    Comment: Performed at [facility], [address], [place], [place] [address]  CBC     Status: None   Collection Time: 11/11/23  4:48 PM  Result Value Ref Range   WBC 5.8 4.0 - 10.5 K/uL   RBC 4.89 4.22 - 5.81 MIL/uL   Hemoglobin 14.8 13.0 - 17.0 g/dL   HCT 95.1 88.4 - 16.6 %   MCV 93.7 80.0 - 100.0 fL   MCH 30.3 26.0 - 34.0 pg   MCHC 32.3 30.0 - 36.0 g/dL   RDW 06.3 01.6 - 01.0 %   Platelets 240 150 - 400 K/uL   nRBC 0.0 0.0 - 0.2 %    Comment: Performed at [facility], [address], [place], [place] [address]    No results found.  Review of Systems negative except above Blood pressure 105/63, pulse 68, temperature 98.2 F (36.8 C), temperature source Temporal, resp. rate 10, height 5\' 7"  (1.702 m), weight 59 kg, SpO2  97%. Physical Exam vital signs stable afebrile no acute distress exam please see preassessment evaluation labs reviewed and normal  Assessment/Plan: Probable food impaction in patient with probable Mallory-Weiss tear from food impaction Plan: The risk benefits and methods of endoscopy was discussed with the patient and we will proceed today with anesthesia's assistance with further workup and plans pending those findings  Karlina Suares E 11/11/2023, 7:03 PM

## 2023-11-11 NOTE — Discharge Instructions (Addendum)
 Clear liquids only today and tomorrow may have heavy liquids and very soft solids like scrambled eggs oatmeal yogurt etc. and please call if question or problem otherwise follow-up with your [facility] physicians per their recommendations and make sure to eat slowly take small bites chew your food well and drink plenty of fluids and depending on how swallowing may slowly advance diet this weekend     YOU HAD AN ENDOSCOPIC PROCEDURE TODAY: Refer to the procedure report and other information in the discharge instructions given to you for any specific questions about what was found during the examination. If this information does not answer your questions, please call [facility] office at [phone] to clarify.   YOU SHOULD EXPECT: Some feelings of bloating in the abdomen. Passage of more gas than usual. Walking can help get rid of the air that was put into your GI tract during the procedure and reduce the bloating. If you had a lower endoscopy (such as a colonoscopy or flexible sigmoidoscopy) you may notice spotting of blood in your stool or on the toilet paper. Some abdominal soreness may be present for a day or two, also.  DIET: Your first meal following the procedure should be a light meal and then it is ok to progress to your normal diet. A half-sandwich or bowl of soup is an example of a good first meal. Heavy or fried foods are harder to digest and may make you feel nauseous or bloated. Drink plenty of fluids but you should avoid alcoholic beverages for 24 hours. If you had a esophageal dilation, please see attached instructions for diet.    ACTIVITY: Your care partner should take you home directly after the procedure. You should plan to take it easy, moving slowly for the rest of the day. You can resume normal activity the day after the procedure however YOU SHOULD NOT DRIVE, use power tools, machinery or perform tasks that involve climbing or major physical exertion for 24 hours (because of the sedation  medicines used during the test).   SYMPTOMS TO REPORT IMMEDIATELY: A gastroenterologist can be reached at any hour. Please call [phone]  for any of the following symptoms:   Following upper endoscopy (EGD, EUS, ERCP, esophageal dilation) Vomiting of blood or coffee ground material  New, significant abdominal pain  New, significant chest pain or pain under the shoulder blades  Painful or persistently difficult swallowing  New shortness of breath  Black, tarry-looking or red, bloody stools  FOLLOW UP:  If any biopsies were taken you will be contacted by phone or by letter within the next 1-3 weeks. Call [phone]  if you have not heard about the biopsies in 3 weeks.  Please also call with any specific questions about appointments or follow up tests.

## 2023-11-11 NOTE — ED Notes (Signed)
 Jose Hartman with Endo took patient to endo

## 2023-11-11 NOTE — Op Note (Signed)
 [facility] Patient Name: Jose Hartman Procedure Date: 11/11/2023 MRN: [id number] Attending MD: Vida Rigger , MD, [id number] Date of Birth: 1961/06/12 CSN: [id number] Age: 63 Admit Type: Emergency Department Procedure:                Upper GI endoscopy Indications:              Dysphagia, suspected foreign body in the esophagus Providers:                Vida Rigger, MD, Doristine Mango, RN, Salley Scarlet, Technician Referring MD:              Medicines:                General Anesthesia Complications:            No immediate complications. Estimated Blood Loss:     Estimated blood loss was minimal. Procedure:                Pre-Anesthesia Assessment:                           - Prior to the procedure, a History and Physical                            was performed, and patient medications and                            allergies were reviewed. The patient's tolerance of                            previous anesthesia was also reviewed. The risks                            and benefits of the procedure and the sedation                            options and risks were discussed with the patient.                            All questions were answered, and informed consent                            was obtained. Prior Anticoagulants: The patient has                            taken no anticoagulant or antiplatelet agents                            except for aspirin. ASA Grade Assessment: II - A                            patient with mild systemic disease. After reviewing  the risks and benefits, the patient was deemed in                            satisfactory condition to undergo the procedure.                           After obtaining informed consent, the endoscope was                            passed under direct vision. Throughout the                            procedure, the patient's blood pressure, pulse,  and                            oxygen saturations were monitored continuously. The                            GIF-H190 ([id number]) Olympus endoscope was introduced                            through the mouth, and advanced to the second part                            of duodenum. The upper GI endoscopy was                            accomplished without difficulty. The patient                            tolerated the procedure well. Scope In: Scope Out: Findings:      One linear esophageal ulcer with no stigmata of recent bleeding was       found. Compatible with a Mallory-Weiss tear but no food impaction was       seen however      Abnormal motility was noted in the distal esophagus. The distal       esophagus/ is spastic, but gives up passage to the endoscope.      One benign-appearing, intrinsic severe stenosis was found. We could not       advance the scope however the stenosis was traversed after dilation. A       TTS dilator was passed through the scope. Dilation with a 05-22-11 mm       balloon dilator was performed to 12 mm. The dilation site was examined       and showed mild mucosal disruption and moderate improvement in luminal       narrowing.      A small amount of food (residue) was found in the gastric body. Lavage       and suction      The duodenal bulb, first portion of the duodenum and second portion of       the duodenum were normal.      The cardia and gastric fundus were normal on retroflexion.      The exam was otherwise without abnormality. Impression:               -  Esophageal linear ulcer with no stigmata of                            recent bleeding. Compatible with Mallory-Weiss tear                           - Abnormal esophageal motility, consistent with                            esophageal spasm.                           - Benign-appearing esophageal stenosis. Dilated.                           - A small amount of food (residue) in the stomach.                            - Normal duodenal bulb, first portion of the                            duodenum and second portion of the duodenum.                           - The examination was otherwise normal.                           - No specimens collected. Moderate Sedation:      Not Applicable - Patient had care per Anesthesia. Recommendation:           - Patient has a contact number available for                            emergencies. The signs and symptoms of potential                            delayed complications were discussed with the                            patient. Return to normal activities tomorrow.                            Written discharge instructions were provided to the                            patient.                           - Clear liquid diet today. May have heavy liquids                            tomorrow and then very slowly advance to soft                            solids and over the weekend if doing well  can                            advance further                           - Continue present medications.                           - Return to GI clinic PRN. Follow-up with your [facility]                            GI doctors as per their routine                           - Telephone GI clinic if symptomatic PRN. Procedure Code(s):        --- Professional ---                           [id number], Esophagogastroduodenoscopy, flexible,                            transoral; with transendoscopic balloon dilation of                            esophagus (less than 30 mm diameter) Diagnosis Code(s):        --- Professional ---                           K22.10, Ulcer of esophagus without bleeding                           K22.4, Dyskinesia of esophagus                           K22.2, Esophageal obstruction                           R13.10, Dysphagia, unspecified                           T18.108A, Unspecified foreign body in esophagus                             causing other injury, initial encounter CPT copyright 2022 [employer]. All rights reserved. The codes documented in this report are preliminary and upon coder review may  be revised to meet current compliance requirements. Vida Rigger, MD 11/11/2023 7:40:14 PM This report has been signed electronically. Number of Addenda: 0

## 2023-11-11 NOTE — Anesthesia Preprocedure Evaluation (Addendum)
 Anesthesia Evaluation  Patient identified by MRN, date of birth, ID band Patient awake    Reviewed: Allergy & Precautions, NPO status , Patient's Chart, lab work & pertinent test results, reviewed documented beta blocker date and time   History of Anesthesia Complications Negative for: history of anesthetic complications  Airway Mallampati: II  TM Distance: >3 FB     Dental  (+) Edentulous Upper, Edentulous Lower   Pulmonary neg shortness of breath, asthma , neg COPD, neg recent URI   Pulmonary exam normal        Cardiovascular (-) hypertension(-) Past MI, (-) Cardiac Stents and (-) Peripheral Vascular Disease (-) Valvular Problems/Murmurs Rhythm:Regular Rate:Normal     Neuro/Psych neg Seizures 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 from [facility] TIA Neuromuscular disease    GI/Hepatic ,GERD  ,,(+) neg Cirrhosis      Esophageal stricture   Endo/Other  neg diabetes    Renal/GU Renal disease     Musculoskeletal  (+) Arthritis ,    Abdominal   Peds  Hematology   Anesthesia Other Findings   Reproductive/Obstetrics                             Anesthesia Physical Anesthesia Plan  ASA: 3  Anesthesia Plan: General   Post-op Pain Management:    Induction: Intravenous and Rapid sequence  PONV Risk Score and Plan: 2 and Ondansetron and Dexamethasone  Airway Management Planned: Oral ETT  Additional Equipment:   Intra-op Plan:   Post-operative Plan: Extubation in OR  Informed Consent: I have reviewed the patients History and Physical, chart, labs and discussed the procedure including the risks, benefits and alternatives for the proposed anesthesia with the patient or authorized representative who has indicated his/her understanding and acceptance.     Dental advisory given  Plan Discussed with: CRNA  Anesthesia Plan Comments:         Anesthesia Quick Evaluation

## 2023-11-13 ENCOUNTER — Encounter (HOSPITAL_COMMUNITY): Payer: Self-pay | Admitting: Gastroenterology

## 2023-11-13 NOTE — Anesthesia Postprocedure Evaluation (Signed)
 Anesthesia Post Note  Patient: NORIS KULINSKI  Procedure(s) Performed: EGD (ESOPHAGOGASTRODUODENOSCOPY) DILATION, ESOPHAGUS     Patient location during evaluation: PACU Anesthesia Type: General Level of consciousness: awake and alert Pain management: pain level controlled Vital Signs Assessment: post-procedure vital signs reviewed and stable Respiratory status: spontaneous breathing, nonlabored ventilation, respiratory function stable and patient connected to nasal cannula oxygen Cardiovascular status: blood pressure returned to baseline and stable Postop Assessment: no apparent nausea or vomiting Anesthetic complications: no   No notable events documented.  Last Vitals:  Vitals:   11/11/23 1842 11/11/23 1935  BP: 105/63 103/67  Pulse: 68 76  Resp: 10 17  Temp: 36.8 C 37 C  SpO2: 97% 100%    Last Pain:  Vitals:   11/11/23 1935  TempSrc:   PainSc: Asleep                 Mariann Barter
